# Patient Record
Sex: Male | Born: 1990 | Race: Black or African American | Hispanic: No | Marital: Single | Smoking: Never smoker
Health system: Southern US, Community
[De-identification: ages and names within clinical notes are randomized; demographics above are authoritative.]

---
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
## 2017-02-16 ENCOUNTER — Emergency Department (HOSPITAL_COMMUNITY): Payer: BLUE CROSS/BLUE SHIELD

## 2017-02-16 ENCOUNTER — Emergency Department (HOSPITAL_COMMUNITY)
Admission: EM | Admit: 2017-02-16 | Discharge: 2017-02-16 | Disposition: A | Discharge status: Home / Self Care | Payer: BLUE CROSS/BLUE SHIELD | Attending: Emergency Medicine | Admitting: Emergency Medicine

## 2017-02-16 ENCOUNTER — Encounter (HOSPITAL_COMMUNITY): Payer: Self-pay | Admitting: *Deleted

## 2017-02-16 DIAGNOSIS — R630 Anorexia: Secondary | ICD-10-CM | POA: Insufficient documentation

## 2017-02-16 DIAGNOSIS — R51 Headache: Secondary | ICD-10-CM | POA: Diagnosis not present

## 2017-02-16 DIAGNOSIS — M791 Myalgia: Secondary | ICD-10-CM | POA: Diagnosis not present

## 2017-02-16 DIAGNOSIS — R509 Fever, unspecified: Secondary | ICD-10-CM

## 2017-02-16 DIAGNOSIS — R42 Dizziness and giddiness: Secondary | ICD-10-CM | POA: Diagnosis not present

## 2017-02-16 LAB — COMPREHENSIVE METABOLIC PANEL
ALK PHOS: 52 U/L (ref 38–126)
ALT: 20 U/L (ref 17–63)
AST: 37 U/L (ref 15–41)
Albumin: 4 g/dL (ref 3.5–5.0)
Anion gap: 10 (ref 5–15)
BILIRUBIN TOTAL: 0.8 mg/dL (ref 0.3–1.2)
BUN: 11 mg/dL (ref 6–20)
CALCIUM: 8.4 mg/dL — AB (ref 8.9–10.3)
CO2: 27 mmol/L (ref 22–32)
CREATININE: 1.21 mg/dL (ref 0.61–1.24)
Chloride: 97 mmol/L — ABNORMAL LOW (ref 101–111)
GFR calc non Af Amer: >60 mL/min (ref >60)
Glucose, Bld: 75 mg/dL (ref 65–99)
Potassium: 3.1 mmol/L — ABNORMAL LOW (ref 3.5–5.1)
SODIUM: 134 mmol/L — AB (ref 135–145)
Total Protein: 7.6 g/dL (ref 6.5–8.1)

## 2017-02-16 LAB — CBC WITH DIFFERENTIAL/PLATELET
Basophils Absolute: 0 10*3/uL (ref 0.0–0.1)
Basophils Relative: 0 %
EOS ABS: 0 10*3/uL (ref 0.0–0.7)
EOS PCT: 0 %
HCT: 45.2 % (ref 39.0–52.0)
Hemoglobin: 15.4 g/dL (ref 13.0–17.0)
LYMPHS ABS: 1.2 10*3/uL (ref 0.7–4.0)
LYMPHS PCT: 21 %
MCH: 30.7 pg (ref 26.0–34.0)
MCHC: 34.1 g/dL (ref 30.0–36.0)
MCV: 90 fL (ref 78.0–100.0)
MONO ABS: 0.7 10*3/uL (ref 0.1–1.0)
Monocytes Relative: 13 %
Neutro Abs: 3.9 10*3/uL (ref 1.7–7.7)
Neutrophils Relative %: 66 %
PLATELETS: 157 10*3/uL (ref 150–400)
RBC: 5.02 MIL/uL (ref 4.22–5.81)
RDW: 12.8 % (ref 11.5–15.5)
WBC: 5.8 10*3/uL (ref 4.0–10.5)

## 2017-02-16 LAB — URINALYSIS, ROUTINE W REFLEX MICROSCOPIC
BACTERIA UA: NONE SEEN
Bilirubin Urine: NEGATIVE
Glucose, UA: NEGATIVE mg/dL
HGB URINE DIPSTICK: NEGATIVE
Ketones, ur: NEGATIVE mg/dL
Leukocytes, UA: NEGATIVE
NITRITE: NEGATIVE
Protein, ur: 30 mg/dL — AB
SPECIFIC GRAVITY, URINE: 1.02 (ref 1.005–1.030)
pH: 5 (ref 5.0–8.0)

## 2017-02-16 MED ORDER — ACETAMINOPHEN 325 MG PO TABS
650.0000 mg | ORAL_TABLET | Freq: Once | ORAL | Status: AC | PRN
  Administered 2017-02-16: 650 mg via ORAL
  Filled 2017-02-16: qty 2

## 2017-02-16 MED ORDER — SODIUM CHLORIDE 0.9 % IV BOLUS (SEPSIS)
1000.0000 mL | Freq: Once | INTRAVENOUS | Status: AC
  Administered 2017-02-16: 1000 mL via INTRAVENOUS

## 2017-02-16 MED ORDER — IBUPROFEN 800 MG PO TABS
800.0000 mg | ORAL_TABLET | Freq: Once | ORAL | Status: AC
  Administered 2017-02-16: 800 mg via ORAL
  Filled 2017-02-16: qty 1

## 2017-02-16 NOTE — ED Provider Notes (Signed)
[facility] DEPT Provider Note   CSN: [id number] Arrival date & time: 02/16/17  1056     History   Chief Complaint Chief Complaint  Patient presents with  . Fever    HPI Eric Underwood is a 26 y.o. male with no significant past medical history presenting with a 4 day history of fever to 103.which has responded briefly to tylenol.  He endorses having a headache and generalized myalgias for the first 2 days, headache now resolved but has lightheadedness with positional changes.  Denies cough, sob, cp, sore throat, neck stiffness, vision changes, n/v/d but has had no solid food intake in 4 days secondary to poor appetite.  He has tried to maintain fluid intake. Denies dysuria, back pain, rash.  No known tick exposures. Works as a Lobbyistfork lift driver, no recent travel or exposure to others with similar illness.  The history is provided by the patient.    History reviewed. No pertinent past medical history.  There are no active problems to display for this patient.   History reviewed. No pertinent surgical history.     Home Medications    Prior to Admission medications   Not on File    Family History No family history on file.  Social History Social History  Substance Use Topics  . Smoking status: Never Smoker  . Smokeless tobacco: Never Used  . Alcohol use No     Allergies   Penicillins   Review of Systems Review of Systems  Constitutional: Positive for fever.  HENT: Negative for congestion and sore throat.   Eyes: Negative.   Respiratory: Negative for chest tightness and shortness of breath.   Cardiovascular: Negative for chest pain.  Gastrointestinal: Negative for abdominal pain and nausea.  Genitourinary: Negative.   Musculoskeletal: Positive for myalgias. Negative for arthralgias, joint swelling and neck pain.  Skin: Negative.  Negative for rash and wound.  Neurological: Positive for headaches. Negative for dizziness, weakness, light-headedness and  numbness.  Psychiatric/Behavioral: Negative.      Physical Exam Updated Vital Signs BP 120/80   Pulse 83   Temp 99 F (37.2 C) (Oral)   Resp 18   Ht 6\' 1"  (1.854 m)   Wt 87 kg (191 lb 12.8 oz)   SpO2 97%   BMI 25.30 kg/m   Physical Exam  Constitutional: He appears well-developed and well-nourished. No distress.  HENT:  Head: Normocephalic and atraumatic.  Mouth/Throat: Oropharynx is clear and moist. No oropharyngeal exudate.  Eyes: Pupils are equal, round, and reactive to light. Conjunctivae and EOM are normal.  Neck: Normal range of motion. Neck supple.  Cardiovascular: Regular rhythm, normal heart sounds and intact distal pulses.  Tachycardia present.   Pulmonary/Chest: Effort normal and breath sounds normal. He has no wheezes.  Abdominal: Soft. Bowel sounds are normal. He exhibits no mass. There is no tenderness. There is no guarding.  Musculoskeletal: Normal range of motion.  Lymphadenopathy:    He has no cervical adenopathy.  Neurological: He is alert.  Skin: Skin is warm and dry. No rash noted.  Psychiatric: He has a normal mood and affect.  Nursing note and vitals reviewed.    ED Treatments / Results  Labs (all labs ordered are listed, but only abnormal results are displayed) Labs Reviewed  COMPREHENSIVE METABOLIC PANEL - Abnormal; Notable for the following:       Result Value   Sodium 134 (*)    Potassium 3.1 (*)    Chloride 97 (*)    Calcium  8.4 (*)    All other components within normal limits  URINALYSIS, ROUTINE W REFLEX MICROSCOPIC - Abnormal; Notable for the following:    APPearance HAZY (*)    Protein, ur 30 (*)    Squamous Epithelial / LPF 0-5 (*)    All other components within normal limits  CULTURE, BLOOD (ROUTINE X 2)  CULTURE, BLOOD (ROUTINE X 2)  CBC WITH DIFFERENTIAL/PLATELET    EKG  EKG Interpretation None       Radiology Dg Chest 2 View  Result Date: 02/16/2017 CLINICAL DATA:  Fever and dizziness for 4 days. EXAM: CHEST  2  VIEW COMPARISON:  PA and lateral chest 12/31/2008. FINDINGS: Lungs are clear. Heart size is normal. No pneumothorax pleural fluid. No bony abnormality. IMPRESSION: Normal chest. Electronically Signed   By: Drusilla Kannerhomas  Dalessio M.D.   On: 02/16/2017 14:56    Procedures Procedures (including critical care time)  Medications Ordered in ED Medications  acetaminophen (TYLENOL) tablet 650 mg (650 mg Oral Given 02/16/17 1129)  sodium chloride 0.9 % bolus 1,000 mL (0 mLs Intravenous Stopped 02/16/17 1619)  ibuprofen (ADVIL,MOTRIN) tablet 800 mg (800 mg Oral Given 02/16/17 1320)     Initial Impression / Assessment and Plan / ED Course  I have reviewed the triage vital signs and the nursing notes.  Pertinent labs & imaging results that were available during my care of the patient were reviewed by me and considered in my medical decision making (see chart for details).     Labs and imaging reviewed and stable. Pt with probable viral illness. Blood cx pending and pt aware of these pending tests.  Rest, increased fluid intake, alternate tylenol/motrin for fever reduction.  He was given strict return precautions.  Final Clinical Impressions(s) / ED Diagnoses   Final diagnoses:  Febrile illness    New Prescriptions There are no discharge medications for this patient.    Burgess Amordol, Maiya Kates, PA-C 02/16/17 1633    Samuel JesterMcManus, Kathleen, DO 02/20/17 1404

## 2017-02-16 NOTE — ED Notes (Signed)
Patient drank of water, tolerated well.

## 2017-02-16 NOTE — Discharge Instructions (Signed)
Your labs and chest xray are normal today.  As discussed, your blood cultures are pending and you will be notified if these are suggestive of being positive.  I suspect you have a viral infection however that should run its course. If you have any worsened or new symptoms, or your fever persists beyond the next several days, return here or see your doctor for a recheck.  Rest and make sure you are drinking plenty of fluids.  You may consider alternating tylenol and motrin taking the opposite medicine every 3 hours for better fever relief.

## 2017-02-16 NOTE — ED Triage Notes (Signed)
Also c/o dizziness. 

## 2017-02-16 NOTE — ED Notes (Signed)
PA at bedside.

## 2017-02-16 NOTE — ED Triage Notes (Signed)
Fever onset 4 days ago.

## 2017-02-17 ENCOUNTER — Emergency Department (HOSPITAL_COMMUNITY)
Admission: EM | Admit: 2017-02-17 | Discharge: 2017-02-17 | Disposition: A | Discharge status: Home / Self Care | Payer: BLUE CROSS/BLUE SHIELD | Attending: Emergency Medicine | Admitting: Emergency Medicine

## 2017-02-17 ENCOUNTER — Encounter (HOSPITAL_COMMUNITY): Payer: Self-pay | Admitting: Emergency Medicine

## 2017-02-17 DIAGNOSIS — R21 Rash and other nonspecific skin eruption: Secondary | ICD-10-CM

## 2017-02-17 DIAGNOSIS — R509 Fever, unspecified: Secondary | ICD-10-CM | POA: Diagnosis not present

## 2017-02-17 MED ORDER — OXYCODONE HCL 5 MG PO TABS
5.0000 mg | ORAL_TABLET | Freq: Once | ORAL | Status: AC
  Administered 2017-02-17: 5 mg via ORAL
  Filled 2017-02-17: qty 1

## 2017-02-17 MED ORDER — HYDROCODONE-ACETAMINOPHEN 5-325 MG PO TABS: 1.0000 | ORAL_TABLET | ORAL | 0 refills | Status: DC | PRN

## 2017-02-17 MED ORDER — ACYCLOVIR 400 MG PO TABS: 400.0000 mg | ORAL_TABLET | Freq: Three times a day (TID) | ORAL | 0 refills | Status: DC

## 2017-02-17 MED ORDER — ACYCLOVIR 800 MG PO TABS
400.0000 mg | ORAL_TABLET | Freq: Once | ORAL | Status: AC
  Administered 2017-02-17: 800 mg via ORAL
  Filled 2017-02-17: qty 1

## 2017-02-17 MED ORDER — ACETAMINOPHEN 500 MG PO TABS
1000.0000 mg | ORAL_TABLET | Freq: Once | ORAL | Status: AC
  Administered 2017-02-17: 1000 mg via ORAL
  Filled 2017-02-17: qty 2

## 2017-02-17 NOTE — ED Triage Notes (Signed)
Patient complaining of abscess to scrotum since yesterday.

## 2017-02-17 NOTE — Discharge Instructions (Signed)
As discussed,  I suspect that your rash may be a primary outbreak of herpes ( I have provided information about this infection).  However,  I do not want to give you this diagnosis unless your culture is positive.  You are being treated for this however with the acyclovir prescribed.  You may take the hydrocodone prescribed for pain relief.  This will make you drowsy - do not drive within 4 hours of taking this medication.  Call your doctor for a recheck early this coming week (and he will also be able to follow up on the culture sent today).

## 2017-02-17 NOTE — ED Provider Notes (Signed)
[facility] Provider Note   CSN: [id number] Arrival date & time: 02/17/17  [id number]     History   Chief Complaint Chief Complaint  Patient presents with  . Abscess    HPI Eric Underwood is a 26 y.o. male who was seen here yesterday for a 4 day history of fever without any other definitive sx other then headache and body aches returns today with a new onset painful rash at his right scrotum. He denies prior history of similar sx, denies dysuria, hematuria, penile discharge and denies being sexually active currently. He has continued to have a fever, his last dose of tylenol was taken early this am.  The history is provided by the patient.    History reviewed. No pertinent past medical history.  There are no active problems to display for this patient.   History reviewed. No pertinent surgical history.     Home Medications    Prior to Admission medications   Medication Sig Start Date End Date Taking? Authorizing Provider  acyclovir (ZOVIRAX) 400 MG tablet Take 1 tablet (400 mg total) by mouth 3 (three) times daily. 02/17/17   Burgess AmorIdol, Elizabet Schweppe, PA-C  HYDROcodone-acetaminophen (NORCO/VICODIN) 5-325 MG tablet Take 1 tablet by mouth every 4 (four) hours as needed. 02/17/17   Burgess AmorIdol, Xoie Kreuser, PA-C    Family History History reviewed. No pertinent family history.  Social History Social History  Substance Use Topics  . Smoking status: Never Smoker  . Smokeless tobacco: Never Used  . Alcohol use No     Allergies   Penicillins   Review of Systems Review of Systems  Constitutional: Positive for fever.  HENT: Negative for congestion and sore throat.   Eyes: Negative.   Respiratory: Negative for chest tightness and shortness of breath.   Cardiovascular: Negative for chest pain.  Gastrointestinal: Negative for abdominal pain and nausea.  Genitourinary: Negative.   Musculoskeletal: Negative for arthralgias, joint swelling and neck pain.  Skin: Positive for rash. Negative for  wound.  Neurological: Negative for dizziness, weakness, light-headedness, numbness and headaches.  Psychiatric/Behavioral: Negative.      Physical Exam Updated Vital Signs BP 131/82   Pulse (!) 120   Temp (!) 103.1 F (39.5 C) Comment: RN offered pt Motrin. pt states he will take it when he gets home.   Resp 18   Ht 6\' 1"  (1.854 m)   Wt 86.6 kg (191 lb)   SpO2 100%   BMI 25.20 kg/m   Physical Exam  Constitutional: He appears well-developed and well-nourished. No distress.  HENT:  Head: Normocephalic and atraumatic.  Eyes: Conjunctivae are normal.  Neck: Normal range of motion.  Cardiovascular: Normal rate, regular rhythm, normal heart sounds and intact distal pulses.   Pulmonary/Chest: Effort normal and breath sounds normal. He has no wheezes.  Abdominal: Soft. Bowel sounds are normal. There is no tenderness.  Genitourinary: Penis normal. Circumcised.  Genitourinary Comments: Painful rash right scotum, several lesions appear to be early wet ulcerations, others with intact vesicles.  No induration, no fluctuance.    Musculoskeletal: Normal range of motion.  Neurological: He is alert.  Skin: Skin is warm and dry.  Psychiatric: He has a normal mood and affect.  Nursing note and vitals reviewed.  Chaperone was present during exam.   ED Treatments / Results  Labs (all labs ordered are listed, but only abnormal results are displayed) Labs Reviewed  HSV CULTURE AND TYPING    EKG  EKG Interpretation None  Radiology Dg Chest 2 View  Result Date: 02/16/2017 CLINICAL DATA:  Fever and dizziness for 4 days. EXAM: CHEST  2 VIEW COMPARISON:  PA and lateral chest 12/31/2008. FINDINGS: Lungs are clear. Heart size is normal. No pneumothorax pleural fluid. No bony abnormality. IMPRESSION: Normal chest. Electronically Signed   By: Drusilla Kannerhomas  Dalessio M.D.   On: 02/16/2017 14:56    Procedures Procedures (including critical care time)  Medications Ordered in ED Medications    acetaminophen (TYLENOL) tablet 1,000 mg (1,000 mg Oral Given 02/17/17 0853)  acyclovir (ZOVIRAX) tablet 400 mg (800 mg Oral Given 02/17/17 0918)  oxyCODONE (Oxy IR/ROXICODONE) immediate release tablet 5 mg (5 mg Oral Given 02/17/17 0917)     Initial Impression / Assessment and Plan / ED Course  I have reviewed the triage vital signs and the nursing notes.  Pertinent labs & imaging results that were available during my care of the patient were reviewed by me and considered in my medical decision making (see chart for details).     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  Pt was started on acyclovir, first dose given here, also prescribed hydrocodone for pain relief.  Offered ibuprofen here for fever, pt defers. Advised he needs close f/u with pcp, he will call for appt early next week at which time cx should be resulted.Advised recheck here for any new or worsened sx.  Final Clinical Impressions(s) / ED Diagnoses   Final diagnoses:  Fever in adult  Scrotal rash    New Prescriptions Discharge Medication List as of 02/17/2017  9:24 AM    START taking these medications   Details  acyclovir (ZOVIRAX) 400 MG tablet Take 1 tablet (400 mg total) by mouth 3 (three) times daily., Starting Fri 02/17/2017, Print    HYDROcodone-acetaminophen (NORCO/VICODIN) 5-325 MG tablet Take 1 tablet by mouth every 4 (four) hours as needed., Starting Fri 02/17/2017, Print         Burgess AmorIdol, Justina Bertini, PA-C 02/17/17 1037    Samuel JesterMcManus, Kathleen, DO 02/20/17 1406

## 2017-02-19 ENCOUNTER — Encounter (HOSPITAL_COMMUNITY): Payer: Self-pay | Admitting: *Deleted

## 2017-02-19 ENCOUNTER — Emergency Department (HOSPITAL_COMMUNITY)
Admission: EM | Admit: 2017-02-19 | Discharge: 2017-02-19 | Disposition: A | Discharge status: Home / Self Care | Payer: BLUE CROSS/BLUE SHIELD | Attending: Emergency Medicine | Admitting: Emergency Medicine

## 2017-02-19 DIAGNOSIS — J029 Acute pharyngitis, unspecified: Secondary | ICD-10-CM | POA: Diagnosis present

## 2017-02-19 DIAGNOSIS — J358 Other chronic diseases of tonsils and adenoids: Secondary | ICD-10-CM

## 2017-02-19 DIAGNOSIS — J039 Acute tonsillitis, unspecified: Secondary | ICD-10-CM | POA: Insufficient documentation

## 2017-02-19 LAB — RAPID STREP SCREEN (MED CTR MEBANE ONLY): STREPTOCOCCUS, GROUP A SCREEN (DIRECT): NEGATIVE

## 2017-02-19 LAB — HSV CULTURE AND TYPING

## 2017-02-19 NOTE — Discharge Instructions (Signed)
Return if any problems.

## 2017-02-19 NOTE — ED Triage Notes (Signed)
Pt c/o sore throat that started yesterday. Unknown fever. Pt's father reports he has been taking Tylenol and Motrin around the clock to help with pain.

## 2017-02-19 NOTE — ED Provider Notes (Signed)
[facility] Provider Note   CSN: [id number] Arrival date & time: 02/19/17  1602     History   Chief Complaint Chief Complaint  Patient presents with  . Sore Throat    HPI Eric Underwood is a 26 y.o. male.  The history is provided by the patient. No language interpreter was used.  Sore Throat  This is a new problem. The current episode started more than 2 days ago. The problem occurs constantly. The problem has been gradually worsening. Nothing aggravates the symptoms. Nothing relieves the symptoms. He has tried nothing for the symptoms.  Pt complains of sorethroat for 2 days.  Pt had a fever earlier in the week.    History reviewed. No pertinent past medical history.  There are no active problems to display for this patient.   History reviewed. No pertinent surgical history.     Home Medications    Prior to Admission medications   Medication Sig Start Date End Date Taking? Authorizing Provider  acyclovir (ZOVIRAX) 400 MG tablet Take 1 tablet (400 mg total) by mouth 3 (three) times daily. 02/17/17   Burgess AmorIdol, Julie, PA-C  HYDROcodone-acetaminophen (NORCO/VICODIN) 5-325 MG tablet Take 1 tablet by mouth every 4 (four) hours as needed. 02/17/17   Burgess AmorIdol, Julie, PA-C    Family History No family history on file.  Social History Social History  Substance Use Topics  . Smoking status: Never Smoker  . Smokeless tobacco: Never Used  . Alcohol use No     Allergies   Penicillins   Review of Systems Review of Systems  All other systems reviewed and are negative.    Physical Exam Updated Vital Signs BP (!) 143/81 (BP Location: Right Arm)   Pulse 89   Temp 98.9 F (37.2 C) (Oral)   Resp 15   Ht 6\' 1"  (1.854 m)   Wt 86.6 kg (191 lb)   SpO2 100%   BMI 25.20 kg/m   Physical Exam  Constitutional: He appears well-developed and well-nourished.  HENT:  Head: Normocephalic and atraumatic.  Right Ear: External ear normal.  Left Ear: External ear normal.  Nose:  Nose normal.  Tonsils slight swelling,  Multiple tonsilliths.   Eyes: Pupils are equal, round, and reactive to light. Conjunctivae are normal.  Neck: Normal range of motion. Neck supple.  Cardiovascular: Normal rate and regular rhythm.   No murmur heard. Pulmonary/Chest: Effort normal and breath sounds normal. No respiratory distress.  Abdominal: Soft. There is no tenderness.  Musculoskeletal: He exhibits no edema.  Neurological: He is alert.  Skin: Skin is warm and dry.  Psychiatric: He has a normal mood and affect.  Nursing note and vitals reviewed.    ED Treatments / Results  Labs (all labs ordered are listed, but only abnormal results are displayed) Labs Reviewed  RAPID STREP SCREEN (NOT AT [facility])  CULTURE, GROUP A STREP [facility])    EKG  EKG Interpretation None       Radiology No results found.  Procedures Procedures (including critical care time)  Medications Ordered in ED Medications - No data to display   Initial Impression / Assessment and Plan / ED Course  I have reviewed the triage vital signs and the nursing notes.  Pertinent labs & imaging results that were available during my care of the patient were reviewed by me and considered in my medical decision making (see chart for details).     Probable viral illness, I doubt strep.  I doubt bacterail infection.  Final Clinical Impressions(s) / ED Diagnoses   Final diagnoses:  Tonsillith    New Prescriptions Discharge Medication List as of 02/19/2017  5:20 PM    An After Visit Summary was printed and given to the patient.    Elson AreasSofia, Dedric Ethington K, PA-C 02/19/17 1851    Vanetta MuldersZackowski, Scott, MD 02/20/17 [phone]

## 2017-02-21 LAB — CULTURE, BLOOD (ROUTINE X 2)
CULTURE: NO GROWTH
Culture: NO GROWTH
Special Requests: ADEQUATE
Special Requests: ADEQUATE

## 2017-02-21 LAB — CULTURE, GROUP A STREP (THRC)

## 2017-06-27 ENCOUNTER — Emergency Department (HOSPITAL_COMMUNITY): Payer: BLUE CROSS/BLUE SHIELD

## 2017-06-27 ENCOUNTER — Emergency Department (HOSPITAL_COMMUNITY)
Admission: EM | Admit: 2017-06-27 | Discharge: 2017-06-28 | Disposition: A | Discharge status: Home / Self Care | Payer: BLUE CROSS/BLUE SHIELD | Attending: Emergency Medicine | Admitting: Emergency Medicine

## 2017-06-27 ENCOUNTER — Other Ambulatory Visit: Payer: Self-pay

## 2017-06-27 ENCOUNTER — Encounter (HOSPITAL_COMMUNITY): Payer: Self-pay | Admitting: *Deleted

## 2017-06-27 DIAGNOSIS — E860 Dehydration: Secondary | ICD-10-CM | POA: Insufficient documentation

## 2017-06-27 DIAGNOSIS — R112 Nausea with vomiting, unspecified: Secondary | ICD-10-CM | POA: Insufficient documentation

## 2017-06-27 DIAGNOSIS — R197 Diarrhea, unspecified: Secondary | ICD-10-CM | POA: Diagnosis not present

## 2017-06-27 DIAGNOSIS — R110 Nausea: Secondary | ICD-10-CM

## 2017-06-27 DIAGNOSIS — R1030 Lower abdominal pain, unspecified: Secondary | ICD-10-CM | POA: Insufficient documentation

## 2017-06-27 DIAGNOSIS — R531 Weakness: Secondary | ICD-10-CM | POA: Diagnosis present

## 2017-06-27 LAB — COMPREHENSIVE METABOLIC PANEL
ALBUMIN: 3.7 g/dL (ref 3.5–5.0)
ALK PHOS: 52 U/L (ref 38–126)
ALT: 28 U/L (ref 17–63)
ANION GAP: 11 (ref 5–15)
AST: 48 U/L — ABNORMAL HIGH (ref 15–41)
BILIRUBIN TOTAL: 1.1 mg/dL (ref 0.3–1.2)
BUN: 18 mg/dL (ref 6–20)
CALCIUM: 8.2 mg/dL — AB (ref 8.9–10.3)
CO2: 22 mmol/L (ref 22–32)
Chloride: 96 mmol/L — ABNORMAL LOW (ref 101–111)
Creatinine, Ser: 1.13 mg/dL (ref 0.61–1.24)
GLUCOSE: 121 mg/dL — AB (ref 65–99)
POTASSIUM: 3.7 mmol/L (ref 3.5–5.1)
SODIUM: 129 mmol/L — AB (ref 135–145)
TOTAL PROTEIN: 7.9 g/dL (ref 6.5–8.1)

## 2017-06-27 LAB — URINALYSIS, ROUTINE W REFLEX MICROSCOPIC
BILIRUBIN URINE: NEGATIVE
Bacteria, UA: NONE SEEN
Glucose, UA: NEGATIVE mg/dL
KETONES UR: NEGATIVE mg/dL
LEUKOCYTES UA: NEGATIVE
NITRITE: NEGATIVE
PH: 6 (ref 5.0–8.0)
Protein, ur: NEGATIVE mg/dL
SPECIFIC GRAVITY, URINE: 1.006 (ref 1.005–1.030)
Squamous Epithelial / LPF: NONE SEEN

## 2017-06-27 LAB — CBC
HEMATOCRIT: 47.2 % (ref 39.0–52.0)
HEMOGLOBIN: 16.1 g/dL (ref 13.0–17.0)
MCH: 30.3 pg (ref 26.0–34.0)
MCHC: 34.1 g/dL (ref 30.0–36.0)
MCV: 88.7 fL (ref 78.0–100.0)
Platelets: 207 10*3/uL (ref 150–400)
RBC: 5.32 MIL/uL (ref 4.22–5.81)
RDW: 13 % (ref 11.5–15.5)
WBC: 12.1 10*3/uL — ABNORMAL HIGH (ref 4.0–10.5)

## 2017-06-27 LAB — LIPASE, BLOOD: Lipase: 19 U/L (ref 11–51)

## 2017-06-27 MED ORDER — LOPERAMIDE HCL 2 MG PO CAPS: 2.0000 mg | ORAL_CAPSULE | Freq: Four times a day (QID) | ORAL | 0 refills | Status: DC | PRN

## 2017-06-27 MED ORDER — ACETAMINOPHEN 325 MG PO TABS
ORAL_TABLET | ORAL | Status: AC
  Filled 2017-06-27: qty 2

## 2017-06-27 MED ORDER — ONDANSETRON 4 MG PO TBDP
4.0000 mg | ORAL_TABLET | Freq: Once | ORAL | Status: AC | PRN
  Administered 2017-06-27: 4 mg via ORAL
  Filled 2017-06-27: qty 1

## 2017-06-27 MED ORDER — ACETAMINOPHEN 325 MG PO TABS
650.0000 mg | ORAL_TABLET | Freq: Once | ORAL | Status: AC
  Administered 2017-06-27: 650 mg via ORAL

## 2017-06-27 MED ORDER — ONDANSETRON 4 MG PO TBDP: 4.0000 mg | ORAL_TABLET | Freq: Three times a day (TID) | ORAL | 0 refills | Status: DC | PRN

## 2017-06-27 MED ORDER — ONDANSETRON HCL 4 MG/5ML PO SOLN
4.0000 mg | Freq: Once | ORAL | Status: AC
  Administered 2017-06-27: 4 mg via ORAL
  Filled 2017-06-27: qty 1

## 2017-06-27 MED ORDER — CIPROFLOXACIN HCL 500 MG PO TABS: 500.0000 mg | ORAL_TABLET | Freq: Two times a day (BID) | ORAL | 0 refills | Status: DC

## 2017-06-27 MED ORDER — DICYCLOMINE HCL 10 MG PO CAPS
10.0000 mg | ORAL_CAPSULE | Freq: Once | ORAL | Status: AC
  Administered 2017-06-27: 10 mg via ORAL
  Filled 2017-06-27: qty 1

## 2017-06-27 MED ORDER — SODIUM CHLORIDE 0.9 % IV BOLUS (SEPSIS)
2000.0000 mL | Freq: Once | INTRAVENOUS | Status: AC
  Administered 2017-06-27: 2000 mL via INTRAVENOUS

## 2017-06-27 MED ORDER — METRONIDAZOLE 500 MG PO TABS: 500.0000 mg | ORAL_TABLET | Freq: Two times a day (BID) | ORAL | 0 refills | Status: DC

## 2017-06-27 MED ORDER — LOPERAMIDE HCL 2 MG PO CAPS
2.0000 mg | ORAL_CAPSULE | Freq: Once | ORAL | Status: AC
  Administered 2017-06-27: 2 mg via ORAL
  Filled 2017-06-27: qty 1

## 2017-06-27 NOTE — Discharge Instructions (Signed)
As we discussed, given your persistent diarrhea, we are going to give you a course of antibiotics (twice daily for 3 days) which may help resolve your symptoms.  Additionally, we encourage you to take over-the-counter loperamide (Imodium) according to the label instructions.  Please also read through the information below about foods that are rich in potassium and foods that can help with diarrhea.  We encourage you to drink plenty of fluids (water or Gatorade) and eat according to the instruction recommendations to help resolve your symptoms. ° °Return to the emergency department with new or worsening symptoms that concern you. ° ° °Diarrhea °Diarrhea is frequent loose and watery bowel movements. It can cause you to feel weak and dehydrated. Dehydration can cause you to become tired and thirsty, have a dry mouth, and have decreased urination that often is dark yellow. Diarrhea is a sign of another problem, most often an infection that will not last Eric Underwood. In most cases, diarrhea typically lasts 2-3 days. However, it can last longer if it is a sign of something more serious. It is important to treat your diarrhea as directed by your caregiver to lessen or prevent future episodes of diarrhea. °CAUSES  °Some common causes include: °Gastrointestinal infections caused by viruses, bacteria, or parasites. °Food poisoning or food allergies. °Certain medicines, such as antibiotics, chemotherapy, and laxatives. °Artificial sweeteners and fructose. °Digestive disorders. °HOME CARE INSTRUCTIONS °Ensure adequate fluid intake (hydration): Have 1 cup (8 oz) of fluid for each diarrhea episode. Avoid fluids that contain simple sugars or sports drinks, fruit juices, whole milk products, and sodas. Your urine should be clear or pale yellow if you are drinking enough fluids. Hydrate with an oral rehydration solution that you can purchase at pharmacies, retail stores, and online. You can prepare an oral rehydration solution at home by  mixing the following ingredients together: ° - tsp table salt. °¾ tsp baking soda. ° tsp salt substitute containing potassium chloride. °1  tablespoons sugar. °1 L (34 oz) of water. °Certain foods and beverages may increase the speed at which food moves through the gastrointestinal (GI) tract. These foods and beverages should be avoided and include: °Caffeinated and alcoholic beverages. °High-fiber foods, such as raw fruits and vegetables, nuts, seeds, and whole grain breads and cereals. °Foods and beverages sweetened with sugar alcohols, such as xylitol, sorbitol, and mannitol. °Some foods may be well tolerated and may help thicken stool including: °Starchy foods, such as rice, toast, pasta, low-sugar cereal, oatmeal, grits, baked potatoes, crackers, and bagels. °Bananas. °Applesauce. °Add probiotic-rich foods to help increase healthy bacteria in the GI tract, such as yogurt and fermented milk products. °Wash your hands well after each diarrhea episode. °Only take over-the-counter or prescription medicines as directed by your caregiver. °Take a warm bath to relieve any burning or pain from frequent diarrhea episodes. °SEEK IMMEDIATE MEDICAL CARE IF:  °You are unable to keep fluids down. °You have persistent vomiting. °You have blood in your stool, or your stools are black and tarry. °You do not urinate in 6-8 hours, or there is only a small amount of very dark urine. °You have abdominal pain that increases or localizes. °You have weakness, dizziness, confusion, or light-headedness. °You have a severe headache. °Your diarrhea gets worse or does not get better. °You have a fever or persistent symptoms for more than 2-3 days. °You have a fever and your symptoms suddenly get worse. °MAKE SURE YOU:  °Understand these instructions. °Will watch your condition. °Will get help right away if   you are not doing well or get worse. °  °This information is not intended to replace advice given to you by your health care provider.  Make sure you discuss any questions you have with your health care provider. °  °Document Released: 06/24/2002 Document Revised: 07/25/2014 Document Reviewed: 03/11/2012 °Elsevier Interactive Patient Education ©2016 [employer] ° °Food Choices to Help Relieve Diarrhea, Adult °When you have diarrhea, the foods you eat and your eating habits are very important. Choosing the right foods and drinks can help relieve diarrhea. Also, because diarrhea can last up to 7 days, you need to replace lost fluids and electrolytes (such as sodium, potassium, and chloride) in order to help prevent dehydration.  °WHAT GENERAL GUIDELINES DO I NEED TO FOLLOW? °Slowly drink 1 cup (8 oz) of fluid for each episode of diarrhea. If you are getting enough fluid, your urine will be clear or pale yellow. °Eat starchy foods. Some good choices include white rice, white toast, pasta, low-fiber cereal, baked potatoes (without the skin), saltine crackers, and bagels. °Avoid large servings of any cooked vegetables. °Limit fruit to two servings per day. A serving is ½ cup or 1 small piece. °Choose foods with less than 2 g of fiber per serving. °Limit fats to less than 8 tsp (38 g) per day. °Avoid fried foods. °Eat foods that have probiotics in them. Probiotics can be found in certain dairy products. °Avoid foods and beverages that may increase the speed at which food moves through the stomach and intestines (gastrointestinal tract). Things to avoid include: °High-fiber foods, such as dried fruit, raw fruits and vegetables, nuts, seeds, and whole grain foods. °Spicy foods and high-fat foods. °Foods and beverages sweetened with high-fructose corn syrup, honey, or sugar alcohols such as xylitol, sorbitol, and mannitol. °WHAT FOODS ARE RECOMMENDED? °Grains °White rice. White, French, or pita breads (fresh or toasted), including plain rolls, buns, or bagels. White pasta. Saltine, soda, or graham crackers. Pretzels. Low-fiber cereal. Cooked cereals made  with water (such as cornmeal, farina, or cream cereals). Plain muffins. Matzo. Melba toast. Zwieback.  °Vegetables °Potatoes (without the skin). Strained tomato and vegetable juices. Most well-cooked and canned vegetables without seeds. Tender lettuce. °Fruits °Cooked or canned applesauce, apricots, cherries, fruit cocktail, grapefruit, peaches, pears, or plums. Fresh bananas, apples without skin, cherries, grapes, cantaloupe, grapefruit, peaches, oranges, or plums.  °Meat and Other Protein Products °Baked or boiled chicken. Eggs. Tofu. Fish. Seafood. Smooth peanut butter. Ground or well-cooked tender beef, ham, veal, lamb, pork, or poultry.  °Dairy °Plain yogurt, kefir, and unsweetened liquid yogurt. Lactose-free milk, buttermilk, or soy milk. Plain hard cheese. °Beverages °Sport drinks. Clear broths. Diluted fruit juices (except prune). Regular, caffeine-free sodas such as ginger ale. Water. Decaffeinated teas. Oral rehydration solutions. Sugar-free beverages not sweetened with sugar alcohols. °Other °Bouillon, broth, or soups made from recommended foods.  °The items listed above may not be a complete list of recommended foods or beverages. Contact your dietitian for more options. °WHAT FOODS ARE NOT RECOMMENDED? °Grains °Whole grain, whole wheat, bran, or rye breads, rolls, pastas, crackers, and cereals. Wild or brown rice. Cereals that contain more than 2 g of fiber per serving. Corn tortillas or taco shells. Cooked or dry oatmeal. Granola. Popcorn. °Vegetables °Raw vegetables. Cabbage, broccoli, Brussels sprouts, artichokes, baked beans, beet greens, corn, kale, legumes, peas, sweet potatoes, and yams. Potato skins. Cooked spinach and cabbage. °Fruits °Dried fruit, including raisins and dates. Raw fruits. Stewed or dried prunes. Fresh apples with skin, apricots, mangoes, pears,   raspberries, and strawberries.  °Meat and Other Protein Products °Chunky peanut butter. Nuts and seeds. Beans and lentils. Bacon.    °Dairy °High-fat cheeses. Milk, chocolate milk, and beverages made with milk, such as milk shakes. Cream. Ice cream. °Sweets and Desserts °Sweet rolls, doughnuts, and sweet breads. Pancakes and waffles. °Fats and Oils °Butter. Cream sauces. Margarine. Salad oils. Plain salad dressings. Olives. Avocados.  °Beverages °Caffeinated beverages (such as coffee, tea, soda, or energy drinks). Alcoholic beverages. Fruit juices with pulp. Prune juice. Soft drinks sweetened with high-fructose corn syrup or sugar alcohols. °Other °Coconut. Hot sauce. Chili powder. Mayonnaise. Gravy. Cream-based or milk-based soups.  °The items listed above may not be a complete list of foods and beverages to avoid. Contact your dietitian for more information. °WHAT SHOULD I DO IF I BECOME DEHYDRATED? °Diarrhea can sometimes lead to dehydration. Signs of dehydration include dark urine and dry mouth and skin. If you think you are dehydrated, you should rehydrate with an oral rehydration solution. These solutions can be purchased at pharmacies, retail stores, or online.  °Drink ½-1 cup (120-240 mL) of oral rehydration solution each time you have an episode of diarrhea. If drinking this amount makes your diarrhea worse, try drinking smaller amounts more often. For example, drink 1-3 tsp (5-15 mL) every 5-10 minutes.  °A general rule for staying hydrated is to drink 1½-2 L of fluid per day. Talk to your health care provider about the specific amount you should be drinking each day. Drink enough fluids to keep your urine clear or pale yellow. °  °This information is not intended to replace advice given to you by your health care provider. Make sure you discuss any questions you have with your health care provider. °  °Document Released: 09/24/2003 Document Revised: 07/25/2014 Document Reviewed: 05/27/2013 °Elsevier Interactive Patient Education ©2016 [employer] ° °Potassium Content of Foods °Potassium is a mineral found in many foods and drinks.  It helps keep fluids and minerals balanced in your body and affects how steadily your heart beats. Potassium also helps control your blood pressure and keep your muscles and nervous system healthy. °Certain health conditions and medicines may change the balance of potassium in your body. When this happens, you can help balance your level of potassium through the foods that you do or do not eat. Your health care provider or dietitian may recommend an amount of potassium that you should have each day. The following lists of foods provide the amount of potassium (in parentheses) per serving in each item. °HIGH IN POTASSIUM  °The following foods and beverages have 200 mg or more of potassium per serving: °Apricots, 2 raw or 5 dry (200 mg). °Artichoke, 1 medium (345 mg). °Avocado, raw,  ¼ each (245 mg). °Banana, 1 medium (425 mg). °Beans, lima, or baked beans, canned, ½ cup (280 mg). °Beans, white, canned, ½ cup (595 mg). °Beef roast, 3 oz (320 mg). °Beef, ground, 3 oz (270 mg). °Beets, raw or cooked, ½ cup (260 mg). °Bran muffin, 2 oz (300 mg). °Broccoli, ½ cup (230 mg). °Brussels sprouts, ½ cup (250 mg). °Cantaloupe, ½ cup (215 mg). °Cereal, 100% bran, ½ cup (200-400 mg). °Cheeseburger, single, fast food, 1 each (225-400 mg). °Chicken, 3 oz (220 mg). °Clams, canned, 3 oz (535 mg). °Crab, 3 oz (225 mg). °Dates, 5 each (270 mg). °Dried beans and peas, ½ cup (300-475 mg). °Figs, dried, 2 each (260 mg). °Fish: halibut, tuna, cod, snapper, 3 oz (480 mg). °Fish: salmon, haddock, swordfish, perch, 3   oz (300 mg). °Fish, tuna, canned 3 oz (200 mg). °French fries, fast food, 3 oz (470 mg). °Granola with fruit and nuts, ½ cup (200 mg). °Grapefruit juice, ½ cup (200 mg). °Greens, beet, ½ cup (655 mg). °Honeydew melon, ½ cup (200 mg). °Kale, raw, 1 cup (300 mg). °Kiwi, 1 medium (240 mg). °Kohlrabi, rutabaga, parsnips, ½ cup (280 mg). °Lentils, ½ cup (365 mg). °Mango, 1 each (325 mg). °Milk, chocolate, 1 cup (420 mg). °Milk: nonfat,  low-fat, whole, buttermilk, 1 cup (350-380 mg). °Molasses, 1 Tbsp (295 mg). °Mushrooms, ½ cup (280) mg. °Nectarine, 1 each (275 mg). °Nuts: almonds, peanuts, hazelnuts, [place], cashew, mixed, 1 oz (200 mg). °Nuts, pistachios, 1 oz (295 mg). °Orange, 1 each (240 mg). °Orange juice, ½ cup (235 mg). °Papaya, medium, ½ fruit (390 mg). °Peanut butter, chunky, 2 Tbsp (240 mg). °Peanut butter, smooth, 2 Tbsp (210 mg). °Pear, 1 medium (200 mg). °Pomegranate, 1 whole (400 mg). °Pomegranate juice, ½ cup (215 mg). °Pork, 3 oz (350 mg). °Potato chips, salted, 1 oz (465 mg). °Potato, baked with skin, 1 medium (925 mg). °Potatoes, boiled, ½ cup (255 mg). °Potatoes, mashed, ½ cup (330 mg). °Prune juice, ½ cup (370 mg). °Prunes, 5 each (305 mg). °Pudding, chocolate, ½ cup (230 mg). °Pumpkin, canned, ½ cup (250 mg). °Raisins, seedless, ¼ cup (270 mg). °Seeds, sunflower or pumpkin, 1 oz (240 mg). °Soy milk, 1 cup (300 mg). °Spinach, ½ cup (420 mg). °Spinach, canned, ½ cup (370 mg). °Sweet potato, baked with skin, 1 medium (450 mg). °Swiss chard, ½ cup (480 mg). °Tomato or vegetable juice, ½ cup (275 mg). °Tomato sauce or puree, ½ cup (400-550 mg). °Tomato, raw, 1 medium (290 mg). °Tomatoes, canned, ½ cup (200-300 mg). °Turkey, 3 oz (250 mg). °Wheat germ, 1 oz (250 mg). °Winter squash, ½ cup (250 mg). °Yogurt, plain or fruited, 6 oz (260-435 mg). °Zucchini, ½ cup (220 mg). °MODERATE IN POTASSIUM °The following foods and beverages have 50-200 mg of potassium per serving: °Apple, 1 each (150 mg). °Apple juice, ½ cup (150 mg). °Applesauce, ½ cup (90 mg). °Apricot nectar, ½ cup (140 mg). °Asparagus, small spears, ½ cup or 6 spears (155 mg). °Bagel, cinnamon raisin, 1 each (130 mg). °Bagel, egg or plain, 4 in., 1 each (70 mg). °Beans, green, ½ cup (90 mg). °Beans, yellow, ½ cup (190 mg). °Beer, regular, 12 oz (100 mg). °Beets, canned, ½ cup (125 mg). °Blackberries, ½ cup (115 mg). °Blueberries, ½ cup (60 mg). °Bread, whole wheat, 1 slice  (70 mg). °Broccoli, raw, ½ cup (145 mg). °Cabbage, ½ cup (150 mg). °Carrots, cooked or raw, ½ cup (180 mg). °Cauliflower, raw, ½ cup (150 mg). °Celery, raw, ½ cup (155 mg). °Cereal, bran flakes, ½cup (120-150 mg). °Cheese, cottage, ½ cup (110 mg). °Cherries, 10 each (150 mg). °Chocolate, 1½ oz bar (165 mg). °Coffee, brewed 6 oz (90 mg). °Corn, ½ cup or 1 ear (195 mg). °Cucumbers, ½ cup (80 mg). °Egg, large, 1 each (60 mg). °Eggplant, ½ cup (60 mg). °Endive, raw, ½cup (80 mg). °English muffin, 1 each (65 mg). °Fish, orange roughy, 3 oz (150 mg). °Frankfurter, beef or pork, 1 each (75 mg). °Fruit cocktail, ½ cup (115 mg). °Grape juice, ½ cup (170 mg). °Grapefruit, ½ fruit (175 mg). °Grapes, ½ cup (155 mg). °Greens: kale, turnip, collard, ½ cup (110-150 mg). °Ice cream or frozen yogurt, chocolate, ½ cup (175 mg). °Ice cream or frozen yogurt, vanilla, ½ cup (120-150 mg). °Lemons, limes, 1 each (80 mg). °Lettuce,   all types, 1 cup (100 mg). °Mixed vegetables, ½ cup (150 mg). °Mushrooms, raw, ½ cup (110 mg). °Nuts: walnuts, pecans, or macadamia, 1 oz (125 mg). °Oatmeal, ½ cup (80 mg). °Okra, ½ cup (110 mg). °Onions, raw, ½ cup (120 mg). °Peach, 1 each (185 mg). °Peaches, canned, ½ cup (120 mg). °Pears, canned, ½ cup (120 mg). °Peas, green, frozen, ½ cup (90 mg). °Peppers, green, ½ cup (130 mg). °Peppers, red, ½ cup (160 mg). °Pineapple juice, ½ cup (165 mg). °Pineapple, fresh or canned, ½ cup (100 mg). °Plums, 1 each (105 mg). °Pudding, vanilla, ½ cup (150 mg). °Raspberries, ½ cup (90 mg). °Rhubarb, ½ cup (115 mg). °Rice, wild, ½ cup (80 mg). °Shrimp, 3 oz (155 mg). °Spinach, raw, 1 cup (170 mg). °Strawberries, ½ cup (125 mg). °Summer squash ½ cup (175-200 mg). °Swiss chard, raw, 1 cup (135 mg). °Tangerines, 1 each (140 mg). °Tea, brewed, 6 oz (65 mg). °Turnips, ½ cup (140 mg). °Watermelon, ½ cup (85 mg). °Wine, red, table, 5 oz (180 mg). °Wine, white, table, 5 oz (100 mg). °LOW IN POTASSIUM °The following foods and  beverages have less than 50 mg of potassium per serving. °Bread, white, 1 slice (30 mg). °Carbonated beverages, 12 oz (less than 5 mg). °Cheese, 1 oz (20-30 mg). °Cranberries, ½ cup (45 mg). °Cranberry juice cocktail, ½ cup (20 mg). °Fats and oils, 1 Tbsp (less than 5 mg). °Hummus, 1 Tbsp (32 mg). °Nectar: papaya, mango, or pear, ½ cup (35 mg). °Rice, white or brown, ½ cup (50 mg). °Spaghetti or macaroni, ½ cup cooked (30 mg). °Tortilla, flour or corn, 1 each (50 mg). °Waffle, 4 in., 1 each (50 mg). °Water chestnuts, ½ cup (40 mg). °  °This information is not intended to replace advice given to you by your health care provider. Make sure you discuss any questions you have with your health care provider. °  °Document Released: 02/15/2005 Document Revised: 07/09/2013 Document Reviewed: 05/31/2013 °Elsevier Interactive Patient Education ©2016 [employer] ° °Probiotics °WHAT ARE PROBIOTICS? °Probiotics are the good bacteria and yeasts that live in your body and keep you and your digestive system healthy. Probiotics also help your body's defense (immune) system and protect your body against bad bacterial growth.  °Certain foods contain probiotics, such as yogurt. Probiotics can also be purchased as a supplement. As with any supplement or drug, it is important to discuss its use with your health care provider.  °WHAT AFFECTS THE BALANCE OF BACTERIA IN MY BODY? °The balance of bacteria in your body can be affected by:  °Antibiotic medicines. Antibiotics are sometimes necessary to treat infection. Unfortunately, they may kill good or friendly bacteria in your body as well as the bad bacteria. This may lead to stomach problems like diarrhea, gas, and cramping. °Disease. Some conditions are the result of an overgrowth of bad bacteria, yeasts, parasites, or fungi. These conditions include:   °Infectious diarrhea. °Stomach and respiratory infections. °Skin infections. °Irritable bowel syndrome (IBS). °Inflammatory bowel  diseases. °Ulcer due to Helicobacter pylori (H. pylori) infection. °Tooth decay and periodontal disease. °Vaginal infections. °Stress and poor diet may also lower the good bacteria in your body.  °WHAT TYPE OF PROBIOTIC IS RIGHT FOR ME? °Probiotics are available over the counter at your local pharmacy, health food, or grocery store. They come in many different forms, combinations of strains, and dosing strengths. Some may need to be refrigerated. Always read the label for storage and usage instructions. °Specific strains have been shown to be more   effective for certain conditions. Ask your health care provider what option is best for you.  °WHY WOULD I NEED PROBIOTICS? °There are many reasons your health care provider might recommend a probiotic supplement, including:  °Diarrhea. °Constipation. °IBS. °Respiratory infections. °Yeast infections. °Acne, eczema, and other skin conditions. °Frequent urinary tract infections (UTIs). °ARE THERE SIDE EFFECTS OF PROBIOTICS? °Some people experience mild side effects when taking probiotics. Side effects are usually temporary and may include:  °Gas. °Bloating. °Cramping. °Rarely, serious side effects, such as infection or immune system changes, may occur. °WHAT ELSE DO I NEED TO KNOW ABOUT PROBIOTICS?  °There are many different strains of probiotics. Certain strains may be more effective depending on your condition. Probiotics are available in varying doses. Ask your health care provider which probiotic you should use and how often.   °If you are taking probiotics along with antibiotics, it is generally recommended to wait at least 2 hours between taking the antibiotic and taking the probiotic.   °FOR MORE INFORMATION:  °[employer] [facility] [employer] http://nccam.nih.gov/ °  °This information is not intended to replace advice given to you by your health care provider. Make sure you discuss any questions you have with your health care provider. °    °Document Released: 01/29/2014 Document Reviewed: 01/29/2014 °Elsevier Interactive Patient Education ©2016 [employer] ° ° °Hypokalemia °Hypokalemia means that the amount of potassium in the blood is lower than normal. Potassium is a chemical, called an electrolyte, that helps regulate the amount of fluid in the body. It also stimulates muscle contraction and helps nerves function properly. Most of the body's potassium is inside of cells, and only a very small amount is in the blood. Because the amount in the blood is so small, minor changes can be life-threatening. °CAUSES °Antibiotics. °Diarrhea or vomiting. °Using laxatives too much, which can cause diarrhea. °Chronic kidney disease. °Water pills (diuretics). °Eating disorders (bulimia). °Low magnesium level. °Sweating a lot. °SIGNS AND SYMPTOMS °Weakness. °Constipation. °Fatigue. °Muscle cramps. °Mental confusion. °Skipped heartbeats or irregular heartbeat (palpitations). °Tingling or numbness. °DIAGNOSIS  °Your health care provider can diagnose hypokalemia with blood tests. In addition to checking your potassium level, your health care provider may also check other lab tests. °TREATMENT °Hypokalemia can be treated with potassium supplements taken by mouth or adjustments in your current medicines. If your potassium level is very low, you may need to get potassium through a vein (IV) and be monitored in the hospital. A diet high in potassium is also helpful. Foods high in potassium are: °Nuts, such as peanuts and pistachios. °Seeds, such as sunflower seeds and pumpkin seeds. °Peas, lentils, and lima beans. °Whole grain and bran cereals and breads. °Fresh fruit and vegetables, such as apricots, avocado, bananas, cantaloupe, kiwi, oranges, tomatoes, asparagus, and potatoes. °Orange and tomato juices. °Red meats. °Fruit yogurt. °HOME CARE INSTRUCTIONS °Take all medicines as prescribed by your health care provider. °Maintain a healthy diet by including nutritious  food, such as fruits, vegetables, nuts, whole grains, and lean meats. °If you are taking a laxative, be sure to follow the directions on the label. °SEEK MEDICAL CARE IF: °Your weakness gets worse. °You feel your heart pounding or racing. °You are vomiting or having diarrhea. °You are diabetic and having trouble keeping your blood glucose in the normal range. °SEEK IMMEDIATE MEDICAL CARE IF: °You have chest pain, shortness of breath, or dizziness. °You are vomiting or having diarrhea for more than 2 days. °You faint. °MAKE SURE YOU:  °Understand   these instructions. °Will watch your condition. °Will get help right away if you are not doing well or get worse. °  °This information is not intended to replace advice given to you by your health care provider. Make sure you discuss any questions you have with your health care provider. °  °Document Released: 07/04/2005 Document Revised: 07/25/2014 Document Reviewed: 01/04/2013 °Elsevier Interactive Patient Education ©2016 [employer]. ° ° ° °

## 2017-06-27 NOTE — ED Provider Notes (Signed)
Emergency Department Provider Note   I have reviewed the triage vital signs and the nursing notes.   HISTORY  Chief Complaint Weakness   HPI Eric Underwood is a 26 y.o. male with no significant PMH presents to the emergency department for evaluation of this with multiple episodes of watery, nonbloody, diarrhea over the past 2 days.  He has vomited twice including once prior to arrival.  Reports associated diffuse abdominal discomfort worse in the lower abdomen.  No radiation of symptoms.  No chest pain or difficulty breathing.  Denies any dysuria, hesitancy, urgency.  He has not noticed any hematuria.  No back pain.  No unilateral weakness or numbness.  Patient denies sick contacts, recent hospitalizations, recent travel, or current antibiotic therapy.    History reviewed. No pertinent past medical history.  There are no active problems to display for this patient.   History reviewed. No pertinent surgical history.  Current Outpatient Rx  . Order #: [id number] Class: Print  . Order #: [id number] Class: Print  . Order #: [id number] Class: Print  . Order #: [id number] Class: Print    Allergies Penicillins  No family history on file.  Social History Social History   Tobacco Use  . Smoking status: Never Smoker  . Smokeless tobacco: Never Used  Substance Use Topics  . Alcohol use: No  . Drug use: No    Review of Systems  Constitutional: No fever/chills. Positive generalized weakness.  Eyes: No visual changes. ENT: No sore throat. Cardiovascular: Denies chest pain. Respiratory: Denies shortness of breath. Gastrointestinal: Positive lower abdominal pain. Positive nausea/vomiting. Positive diarrhea.  No constipation. Genitourinary: Negative for dysuria. Musculoskeletal: Negative for back pain. Skin: Negative for rash. Neurological: Negative for headaches, focal weakness or numbness.  10-point ROS otherwise  negative.  ____________________________________________   PHYSICAL EXAM:  VITAL SIGNS: ED Triage Vitals  Enc Vitals Group     BP 06/27/17 1912 133/89     Pulse Rate 06/27/17 1912 (!) 108     Resp 06/27/17 1912 16     Temp 06/27/17 1912 (!) 100.8 F (38.2 C)     Temp Source 06/27/17 1912 Oral     SpO2 06/27/17 1912 96 %     Weight 06/27/17 1912 178 lb (80.7 kg)     Height 06/27/17 1912 6\' 1"  (1.854 m)     Pain Score 06/27/17 1914 9    Constitutional: Alert and oriented. Well appearing and in no acute distress. Eyes: Conjunctivae are normal.  Head: Atraumatic. Nose: No congestion/rhinnorhea. Mouth/Throat: Mucous membranes are dry.  Neck: No stridor.   Cardiovascular: Tachycardia. Good peripheral circulation. Grossly normal heart sounds.   Respiratory: Normal respiratory effort.  No retractions. Lungs CTAB. Gastrointestinal: Soft with mild diffuse tenderness to palpation. No focal tenderness. No rebound or guarding. No distention.  Musculoskeletal: No lower extremity tenderness nor edema. No gross deformities of extremities. Neurologic:  Normal speech and language. No gross focal neurologic deficits are appreciated.  Skin:  Skin is warm, dry and intact. No rash noted.  ____________________________________________   LABS (all labs ordered are listed, but only abnormal results are displayed)  Labs Reviewed  COMPREHENSIVE METABOLIC PANEL - Abnormal; Notable for the following components:      Result Value   Sodium 129 (*)    Chloride 96 (*)    Glucose, Bld 121 (*)    Calcium 8.2 (*)    AST 48 (*)    All other components within normal limits  CBC - Abnormal; Notable for the following  components:   WBC 12.1 (*)    All other components within normal limits  URINALYSIS, ROUTINE W REFLEX MICROSCOPIC - Abnormal; Notable for the following components:   Hgb urine dipstick LARGE (*)    All other components within normal limits  GASTROINTESTINAL PANEL BY PCR, STOOL (REPLACES  STOOL CULTURE)  LIPASE, BLOOD   ____________________________________________  RADIOLOGY  CT renal pending.  ____________________________________________   PROCEDURES  Procedure(s) performed:   Procedures  None ____________________________________________   INITIAL IMPRESSION / ASSESSMENT AND PLAN / ED COURSE  Pertinent labs & imaging results that were available during my care of the patient were reviewed by me and considered in my medical decision making (see chart for details).  Patient presents to the emergency department with fever, tachycardia, profuse watery diarrhea, nausea/vomiting, and generalized weakness.  Patient with no focal weakness or numbness.  He has no respiratory symptoms.  Abdomen is significant for mild diffuse tenderness but no rebound or guarding.  No focal discomfort.  Symptoms are most consistent with a viral gastroenteritis.  Low suspicion for acute intra-abdominal process. Plan for IVF, Zofran, and labs to screen for electrolyte abnormality and reassess.  11:14 PM Patient labs reviewed. Patient with hematuria of unclear significance. Plan for CT renal protocol. Sending stool cultures for PCP to follow up on as an outpatient. Patient with continued diarrhea here. If CT negative, plan for discharge with Cipro and supportive care at home.   Care transferred to Dr. Bebe ShaggyWickline.  ____________________________________________  FINAL CLINICAL IMPRESSION(S) / ED DIAGNOSES  Final diagnoses:  Generalized weakness  Dehydration  Diarrhea of presumed infectious origin  Nausea     MEDICATIONS GIVEN DURING THIS VISIT:  Medications  acetaminophen (TYLENOL) 325 MG tablet (not administered)  ondansetron (ZOFRAN-ODT) disintegrating tablet 4 mg (4 mg Oral Given 06/27/17 1916)  acetaminophen (TYLENOL) tablet 650 mg (650 mg Oral Given 06/27/17 1916)  sodium chloride 0.9 % bolus 2,000 mL (2,000 mLs Intravenous New Bag/Given 06/27/17 2115)  ondansetron (ZOFRAN) 4  MG/5ML solution 4 mg (4 mg Oral Given 06/27/17 2115)  dicyclomine (BENTYL) capsule 10 mg (10 mg Oral Given 06/27/17 2115)    Note:  This document was prepared using Dragon voice recognition software and may include unintentional dictation errors.  Alona BeneJoshua Long, MD Emergency Medicine    Long, Arlyss RepressJoshua G, MD 06/28/17 [phone]

## 2017-06-27 NOTE — ED Triage Notes (Signed)
Pt reports he started feeling sick yesterday and vomited x1 and has had multiple episodes of diarrhea since yesterday as well.

## 2017-06-28 DIAGNOSIS — R531 Weakness: Secondary | ICD-10-CM | POA: Diagnosis not present

## 2017-06-28 LAB — GASTROINTESTINAL PANEL BY PCR, STOOL (REPLACES STOOL CULTURE)
ADENOVIRUS F40/41: NOT DETECTED
ASTROVIRUS: NOT DETECTED
CAMPYLOBACTER SPECIES: NOT DETECTED
Cryptosporidium: NOT DETECTED
Cyclospora cayetanensis: NOT DETECTED
ENTEROPATHOGENIC E COLI (EPEC): NOT DETECTED
ENTEROTOXIGENIC E COLI (ETEC): NOT DETECTED
Entamoeba histolytica: NOT DETECTED
Enteroaggregative E coli (EAEC): NOT DETECTED
Giardia lamblia: NOT DETECTED
NOROVIRUS GI/GII: NOT DETECTED
PLESIMONAS SHIGELLOIDES: NOT DETECTED
ROTAVIRUS A: NOT DETECTED
SHIGA LIKE TOXIN PRODUCING E COLI (STEC): NOT DETECTED
Salmonella species: NOT DETECTED
Sapovirus (I, II, IV, and V): NOT DETECTED
Shigella/Enteroinvasive E coli (EIEC): DETECTED — AB
VIBRIO SPECIES: NOT DETECTED
Vibrio cholerae: NOT DETECTED
Yersinia enterocolitica: NOT DETECTED

## 2017-06-28 NOTE — ED Notes (Signed)
Pt alert & oriented x4, stable gait. Patient given discharge instructions, paperwork & prescription(s). Patient  instructed to stop at the registration desk to finish any additional paperwork. Patient verbalized understanding. Pt left department w/ no further questions. 

## 2017-06-28 NOTE — ED Notes (Signed)
Pt notified of results.  States he will follow up with his family doctor but the diarrhea has lessened and is is feeling a little better.  Encouraged to push fluids, finish antibiotics, and make PCP appt tomorrow.

## 2017-06-28 NOTE — ED Provider Notes (Signed)
I assumed care in sign out to follow-up with CT imaging CT results reviewed with patient and his family Colitis noted no signs of renal stones Patient was informed of hematuria Plan to discharge home with PCP follow-up Dr. Edwin DadaLong's recommendations we will start Cipro Discussed strict return precautions   Zadie RhineWickline, Shernita Rabinovich, MD 06/28/17 0117

## 2017-06-28 NOTE — ED Notes (Signed)
Date and time results received: 06/28/17 5:54 PM  (use smartphrase ".now" to insert current time)  Test: Stool panel Critical Value: Shigella, entervasive e-coli  Name of Provider Notified: Criss AlvineGoldston  Orders Received? Or Actions Taken?: Orders Received - See Orders for details

## 2017-08-30 ENCOUNTER — Other Ambulatory Visit: Payer: Self-pay

## 2017-08-30 ENCOUNTER — Encounter (HOSPITAL_COMMUNITY): Payer: Self-pay | Admitting: Emergency Medicine

## 2017-08-30 ENCOUNTER — Emergency Department (HOSPITAL_COMMUNITY)
Admission: EM | Admit: 2017-08-30 | Discharge: 2017-08-30 | Disposition: A | Discharge status: Home / Self Care | Payer: BLUE CROSS/BLUE SHIELD | Attending: Emergency Medicine | Admitting: Emergency Medicine

## 2017-08-30 DIAGNOSIS — R590 Localized enlarged lymph nodes: Secondary | ICD-10-CM | POA: Insufficient documentation

## 2017-08-30 DIAGNOSIS — K130 Diseases of lips: Secondary | ICD-10-CM

## 2017-08-30 NOTE — ED Notes (Signed)
PA at the bedside to assess.  

## 2017-08-30 NOTE — ED Provider Notes (Signed)
[facility] EMERGENCY DEPARTMENT Provider Note   CSN: [id number] Arrival date & time: 08/30/17  0700     History   Chief Complaint Chief Complaint  Patient presents with  . Abscess    HPI Eric Underwood is a 27 y.o. male who presents today for evaluation of an area of swelling and tenderness to the right groin for 2 days.  He states that 2 days ago he noted an area of tenderness to the right groin and yesterday he noticed a firm "bump".  Pain worsens with palpation or with bending of the hip.  He denies erythema, drainage, or warmth to the area.  He denies fevers or chills.  He denies urinary symptoms, penile pain, penile drainage, or dysuria.  He states he has not been sexually active in over one year and as had STD testing since then.  He has not tried anything for his symptoms.  He is also complaining of  3 "bumps "that arose to his upper lip today.  He states that he will occasionally get similar bumps which he believes to be pimples which she will eventually "pop "with purulent drainage.  He states that the area is nontender, no burning or pain.  He denies any drainage.  No prior history of cultures.  The history is provided by the patient.    History reviewed. No pertinent past medical history.  There are no active problems to display for this patient.   History reviewed. No pertinent surgical history.     Home Medications    Prior to Admission medications   Medication Sig Start Date End Date Taking? Authorizing Provider  ciprofloxacin (CIPRO) 500 MG tablet Take 1 tablet (500 mg total) by mouth 2 (two) times daily. 06/27/17   Long, Arlyss Repress, MD  loperamide (IMODIUM) 2 MG capsule Take 1 capsule (2 mg total) by mouth 4 (four) times daily as needed for diarrhea or loose stools. 06/27/17   Long, Arlyss Repress, MD  ondansetron (ZOFRAN ODT) 4 MG disintegrating tablet Take 1 tablet (4 mg total) by mouth every 8 (eight) hours as needed for nausea or vomiting. 06/27/17   Long,  Arlyss Repress, MD    Family History History reviewed. No pertinent family history.  Social History Social History   Tobacco Use  . Smoking status: Never Smoker  . Smokeless tobacco: Never Used  Substance Use Topics  . Alcohol use: No  . Drug use: No     Allergies   Penicillins   Review of Systems Review of Systems  Constitutional: Positive for fever. Negative for chills.  Gastrointestinal: Negative for abdominal pain, nausea and vomiting.  Genitourinary: Negative for discharge, dysuria, frequency, hematuria, penile pain, penile swelling, scrotal swelling, testicular pain and urgency.  Skin:       Swelling     Physical Exam Updated Vital Signs BP (!) 146/81 (BP Location: Right Arm)   Pulse 85   Temp 98.6 F (37 C) (Oral)   Resp 18   Ht 6\' 1"  (1.854 m)   Wt 81.6 kg (180 lb)   SpO2 100%   BMI 23.75 kg/m   Physical Exam  Constitutional: He appears well-developed and well-nourished. No distress.  HENT:  Head: Normocephalic and atraumatic.  Three raised circular areas consistent with acne to the vermillion border of the right upper lip.  No tenderness, no drainage.  Nonvesicular.   Eyes: Conjunctivae and EOM are normal. Pupils are equal, round, and reactive to light. Right eye exhibits no discharge. Left eye  exhibits no discharge.  Neck: Normal range of motion. Neck supple. No JVD present. No tracheal deviation present.  Cardiovascular: Normal rate.  Pulmonary/Chest: Effort normal.  Abdominal: Soft. Bowel sounds are normal. He exhibits no distension. There is no tenderness. There is no guarding.  Musculoskeletal: He exhibits no edema.  Neurological: He is alert.  Skin: Skin is warm and dry. No erythema.  1x1cm firm well subsequent circumscribed nodule to the right inguinal region, tender to palpation.  No erythema, induration, or swelling noted.  Psychiatric: He has a normal mood and affect. His behavior is normal.  Nursing note and vitals reviewed.    ED  Treatments / Results  Labs (all labs ordered are listed, but only abnormal results are displayed) Labs Reviewed - No data to display  EKG  EKG Interpretation None       Radiology No results found.  Procedures Procedures (including critical care time) EMERGENCY DEPARTMENT US SOFT TISSUE INTERPRETATION "Study: Limited Soft Tissue Ultrasound"  INDICATIONS: Pain Multiple views of the body part were obtained in real-time with a multi-frequency linear probe  PERFORMED BY: Myself IMAGES ARCHIVED?: Yes SIDE:Right  BODY PART:groin INTERPRETATION:  No abcess noted and No cellulitis noted     Medications Ordered in ED Medications - No data to display   Initial Impression / Assessment and Plan / ED Course  I have reviewed the triage vital signs and the nursing notes.  Pertinent labs & imaging results that were available during my care of the patient were reviewed by me and considered in my medical decision making (see chart for details).     Patient presents with right-sided inguinal lymphadenopathy.  No urinary symptoms and no GU symptoms in general.  He states that he was recently tested for STDs and was found to be negative and has not been sexually active since then.  Doubt acute life-threatening GI/GU emergency. Bedside ultrasound was performed with no evidence of cellulitis or abscess.  He also has what appeared to be small areas of acne to the right upper lip.  No evidence of abscess or secondary skin infection in either case.  Advised patient to apply warm compresses.  Ibuprofen and Tylenol for pain.  Recommend follow-up with primary care physician for reevaluation of symptoms persist.  Discussed indications for return to the ED. Pt verbalized understanding of and agreement with plan and is safe for discharge home at this time.  no complaints prior to discharge.  Final Clinical Impressions(s) / ED Diagnoses   Final diagnoses:  Lip lesion  Lymphadenopathy, inguinal     ED Discharge Orders    None       Jeanie SewerFawze, Dickie Labarre A, PA-C 08/30/17 1412    Samuel JesterMcManus, Kathleen, DO 09/01/17 1304

## 2017-08-30 NOTE — ED Notes (Signed)
Patient given discharge instruction, verbalized understand. IV removed, band aid applied. Patient ambulatory out of the department.  

## 2017-08-30 NOTE — Discharge Instructions (Signed)
Apply warm compresses to the affected areas 2-3 times daily. Alternate 600 mg of ibuprofen and [phone] mg of Tylenol every 3 hours as needed for pain. Do not exceed 4000 mg of Tylenol daily.  Follow-up with primary care physician for reevaluation of symptoms.  Return to the emergency department if any concerning signs or symptoms develop such as fevers, redness, worsening swelling, or abnormal drainage.

## 2017-08-30 NOTE — ED Triage Notes (Signed)
Pt reports right hip x2 days. Pt reports site tender x1 day and reports "a bump" came on right hip yesterday. Pt denies fever, chills, nausea.

## 2018-08-13 ENCOUNTER — Other Ambulatory Visit: Payer: Self-pay

## 2018-08-13 ENCOUNTER — Encounter (HOSPITAL_COMMUNITY): Payer: Self-pay | Admitting: Emergency Medicine

## 2018-08-13 ENCOUNTER — Emergency Department (HOSPITAL_COMMUNITY)
Admission: EM | Admit: 2018-08-13 | Discharge: 2018-08-13 | Disposition: A | Discharge status: Home / Self Care | Payer: BLUE CROSS/BLUE SHIELD | Attending: Emergency Medicine | Admitting: Emergency Medicine

## 2018-08-13 DIAGNOSIS — K6289 Other specified diseases of anus and rectum: Secondary | ICD-10-CM | POA: Insufficient documentation

## 2018-08-13 DIAGNOSIS — Z79899 Other long term (current) drug therapy: Secondary | ICD-10-CM | POA: Diagnosis not present

## 2018-08-13 DIAGNOSIS — K629 Disease of anus and rectum, unspecified: Secondary | ICD-10-CM | POA: Diagnosis present

## 2018-08-13 MED ORDER — LIDOCAINE-HYDROCORTISONE ACE 3-1 % RE KIT: 1.0000 "application " | PACK | Freq: Two times a day (BID) | RECTAL | 0 refills | Status: DC

## 2018-08-13 NOTE — Discharge Instructions (Addendum)
Use the medication prescribed for your pain and suspected internal hemorrhoid.  You should followup with a general surgeon (Dr. Lovell Sheehan) who can assist with an internal exam if your symptoms are not improving.  I recommend a warm water soak twice daily prior to applying the medicated cream.  This should help with pain and with healing.

## 2018-08-13 NOTE — ED Triage Notes (Signed)
Patient reports rectal pain that started on Wednesday, denies bleeding. Last BM this am.

## 2018-08-13 NOTE — ED Provider Notes (Signed)
[facility] EMERGENCY DEPARTMENT Provider Note   CSN: [id number] Arrival date & time: 08/13/18  1103     History   Chief Complaint Chief Complaint  Patient presents with  . Rectal Pain    HPI Eric Underwood is a 28 y.o. male with no significant past medical history but with a prior history of hemorrhoids presenting with a 5 day history of rectal pain.  He reports no problems with rectal swelling, bleeding or constipation but endorses increased pain with bowel movements, described as sharp and severe, lasting about 15 minutes after the bm, then feels just like a pressure sensation chronically.  He has used warm soaks and preparation H with minimal improvement in his symptoms.  He denies fevers, chills, abdominal pain, no changes in his urine stream or in frequency of urination.  He is not sexually active, denies any rectal trauma.    The history is provided by the patient.    History reviewed. No pertinent past medical history.  There are no active problems to display for this patient.   History reviewed. No pertinent surgical history.      Home Medications    Prior to Admission medications   Medication Sig Start Date End Date Taking? Authorizing Provider  ciprofloxacin (CIPRO) 500 MG tablet Take 1 tablet (500 mg total) by mouth 2 (two) times daily. 06/27/17   Long, Wonda Olds, MD  lidocaine-hydrocortisone (ANAMANTLE) 3-1 % KIT Place 1 application rectally 2 (two) times daily. 08/13/18   Evalee Jefferson, PA-C  loperamide (IMODIUM) 2 MG capsule Take 1 capsule (2 mg total) by mouth 4 (four) times daily as needed for diarrhea or loose stools. 06/27/17   Long, Wonda Olds, MD  ondansetron (ZOFRAN ODT) 4 MG disintegrating tablet Take 1 tablet (4 mg total) by mouth every 8 (eight) hours as needed for nausea or vomiting. 06/27/17   Long, Wonda Olds, MD    Family History History reviewed. No pertinent family history.  Social History Social History   Tobacco Use  . Smoking status: Never  Smoker  . Smokeless tobacco: Never Used  Substance Use Topics  . Alcohol use: No  . Drug use: No     Allergies   Penicillins   Review of Systems Review of Systems  Constitutional: Negative for chills and fever.  HENT: Negative.   Eyes: Negative.   Respiratory: Negative for chest tightness and shortness of breath.   Cardiovascular: Negative for chest pain.  Gastrointestinal: Positive for rectal pain. Negative for abdominal pain, anal bleeding, nausea and vomiting.  Genitourinary: Negative.  Negative for decreased urine volume, hematuria, penile pain and testicular pain.  Musculoskeletal: Negative for arthralgias, joint swelling and neck pain.  Skin: Negative.  Negative for rash and wound.  Neurological: Negative.   Psychiatric/Behavioral: Negative.      Physical Exam Updated Vital Signs BP 124/78 (BP Location: Right Arm)   Pulse 79   Temp 98.7 F (37.1 C) (Oral)   Resp 16   Ht '6\' 1"'  (1.854 m)   Wt 81.6 kg   SpO2 98%   BMI 23.75 kg/m   Physical Exam Vitals signs and nursing note reviewed. Exam conducted with a chaperone present.  Constitutional:      Appearance: He is well-developed.  HENT:     Head: Normocephalic and atraumatic.  Eyes:     Conjunctiva/sclera: Conjunctivae normal.  Neck:     Musculoskeletal: Normal range of motion.  Cardiovascular:     Rate and Rhythm: Normal rate.  Pulmonary:  Effort: Pulmonary effort is normal.     Breath sounds: No wheezing.  Abdominal:     General: Bowel sounds are normal.     Palpations: Abdomen is soft. There is no mass.     Tenderness: There is no abdominal tenderness. There is no guarding.  Genitourinary:    Prostate: Not tender.     Rectum: Tenderness present. No mass, anal fissure, external hemorrhoid or internal hemorrhoid. Abnormal anal tone.     Comments: Increased anal tone.  No obvious fissure appreciated.  No external or internal hemorrhoid. Musculoskeletal: Normal range of motion.  Skin:    General:  Skin is warm and dry.  Neurological:     Mental Status: He is alert.      ED Treatments / Results  Labs (all labs ordered are listed, but only abnormal results are displayed) Labs Reviewed - No data to display  EKG None  Radiology No results found.  Procedures Procedures (including critical care time)  Medications Ordered in ED Medications - No data to display   Initial Impression / Assessment and Plan / ED Course  I have reviewed the triage vital signs and the nursing notes.  Pertinent labs & imaging results that were available during my care of the patient were reviewed by me and considered in my medical decision making (see chart for details).     Patient with rectal pain of unclear etiology, he may simply be having rectal spasm.  There is no obvious anal fissure.  There is no edema or induration to suggest abscess.  He was prescribed lidocaine with hydrocortisone, discussed warm sits baths twice daily.  He denies constipation.  He was referred to general surgery for follow-up care if symptoms are not improved with this treatment.  Final Clinical Impressions(s) / ED Diagnoses   Final diagnoses:  Rectal pain    ED Discharge Orders         Ordered    lidocaine-hydrocortisone (ANAMANTLE) 3-1 % KIT  2 times daily     08/13/18 1725           Evalee Jefferson, Hershal Coria 08/13/18 1747    Milton Ferguson, MD 08/13/18 2332

## 2018-10-16 IMAGING — DX DG CHEST 2V
2 series · 2 of 2 positions shown · non-contrast
Comparison: PA and lateral chest 12/31/2008.

CLINICAL DATA: Fever and dizziness for 4 days.

EXAM:
CHEST  2 VIEW

[chest pa]
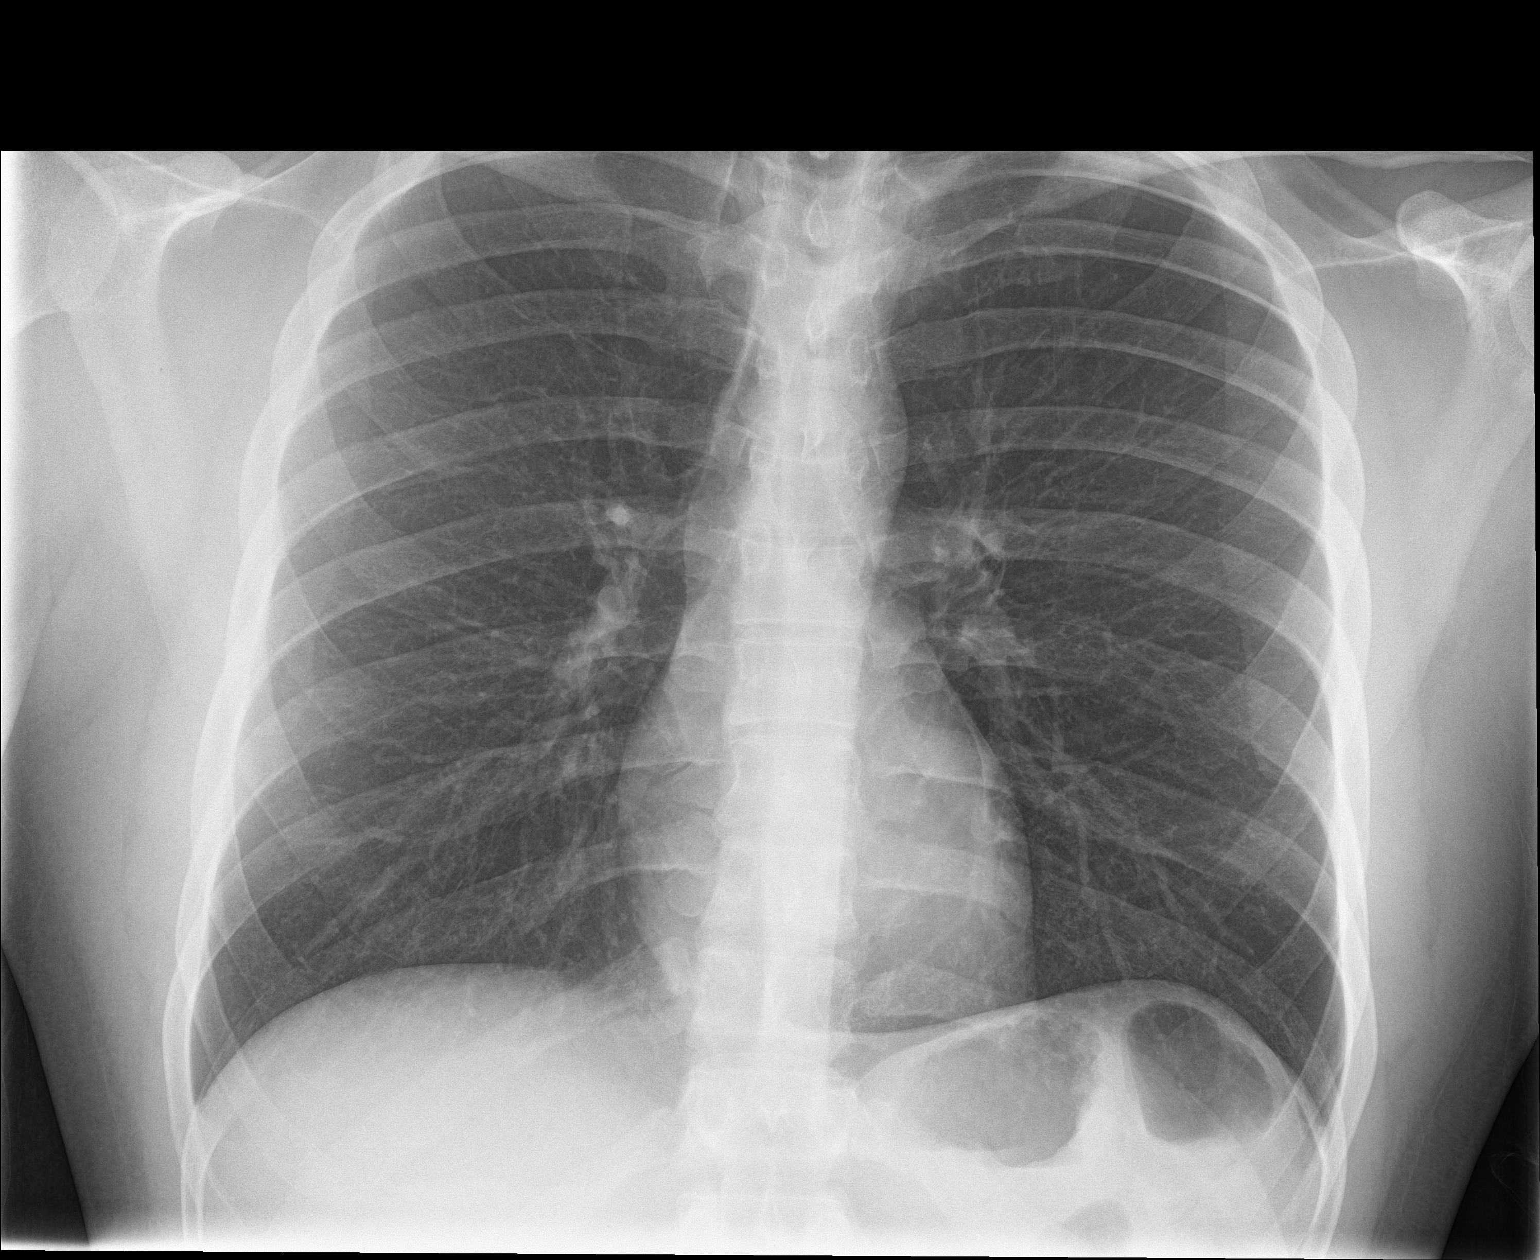

[chest lat]
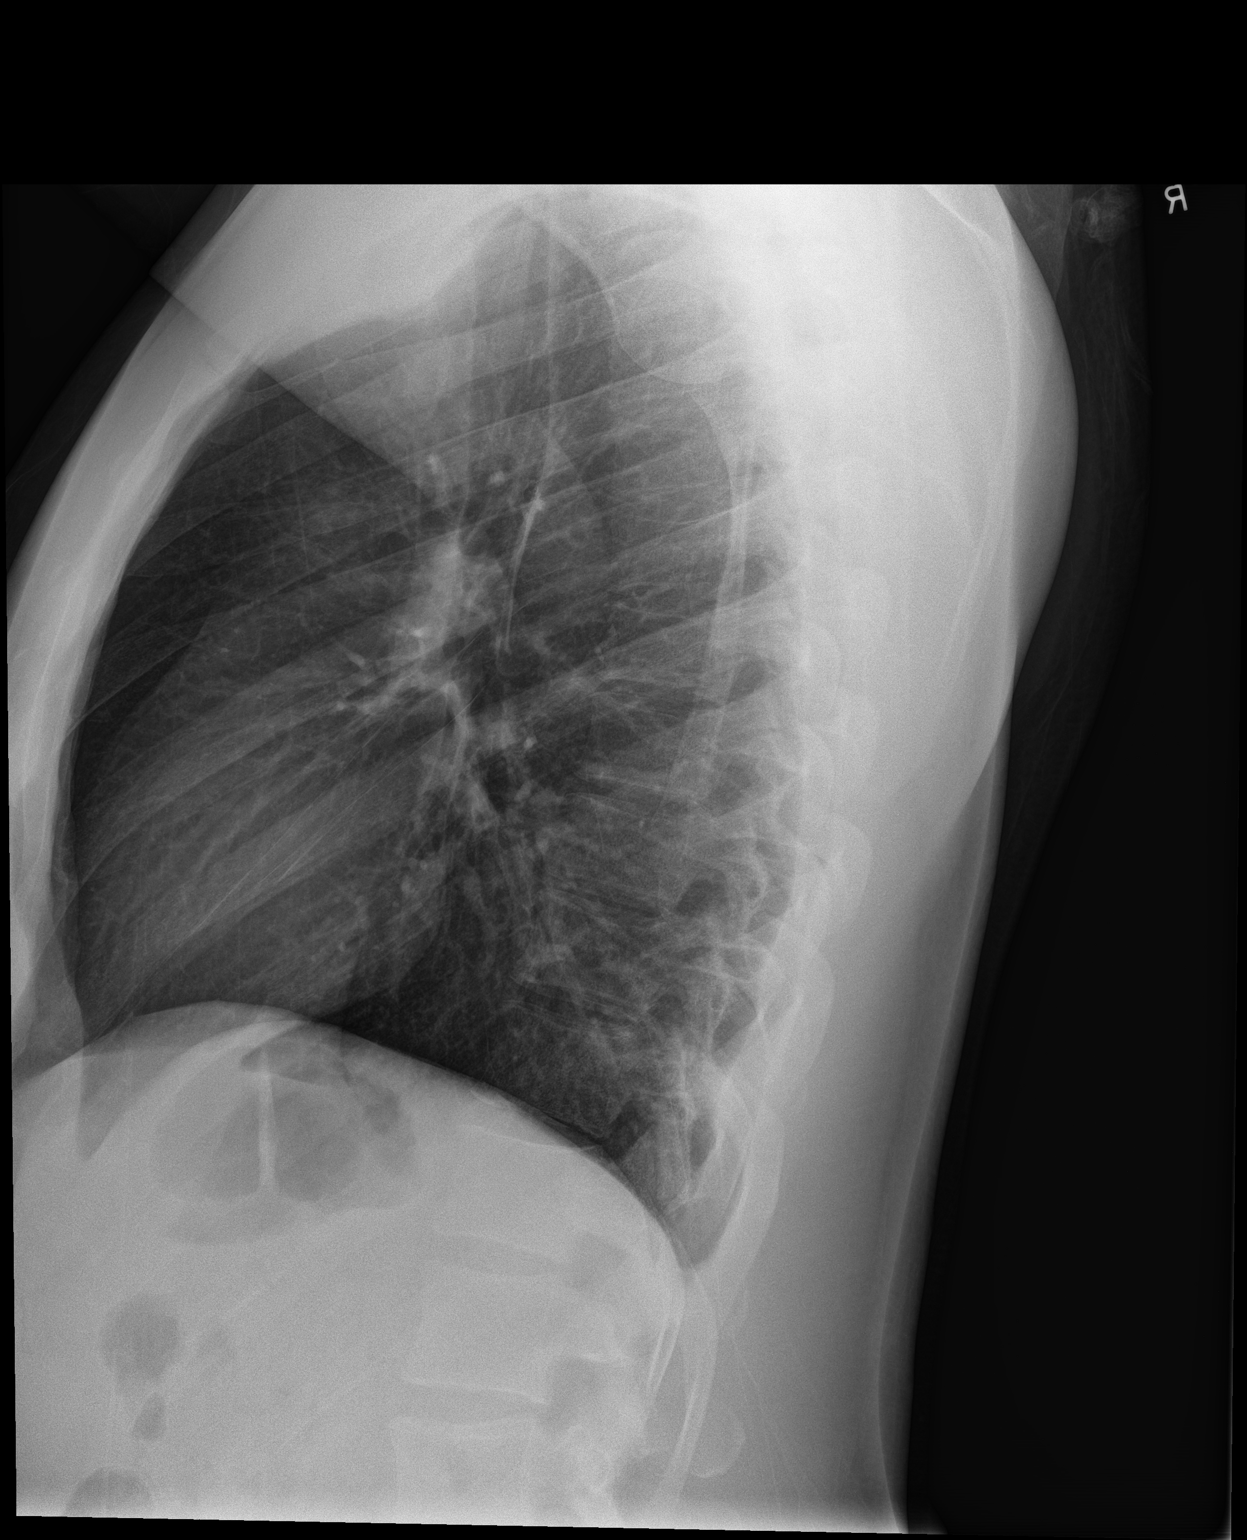

[2 of 2 positions shown; findings below may reference images not displayed]

FINDINGS: Lungs are clear. Heart size is normal. No pneumothorax pleural
fluid. No bony abnormality.
IMPRESSION: Normal chest.

## 2019-02-25 IMAGING — CT CT RENAL STONE PROTOCOL
2 of 4 series · 16 of 46 positions shown, 18 images · non-contrast
Comparison: None.

CLINICAL DATA: Vomiting and diarrhea, onset yesterday.  Hematuria.

EXAM:
CT ABDOMEN AND PELVIS WITHOUT CONTRAST
TECHNIQUE: Multidetector CT imaging of the abdomen and pelvis was performed
following the standard protocol without IV contrast.

[Series 2: axial st · axial · 0.72mm/px · z∈[+906,+1351]mm · 13 of 99 slices shown, 15 images]
[im 5/99  soft-tissue]
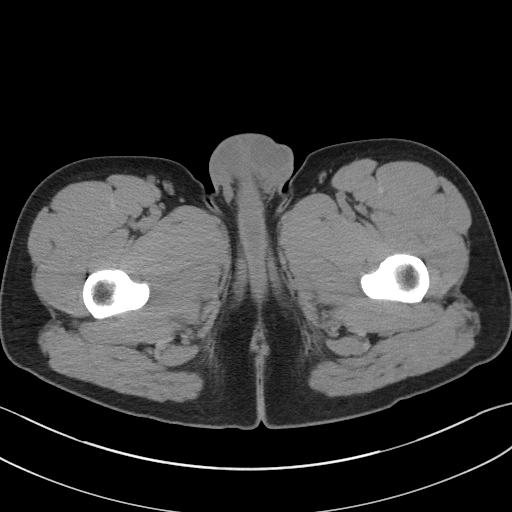
[im 5/99  bone]
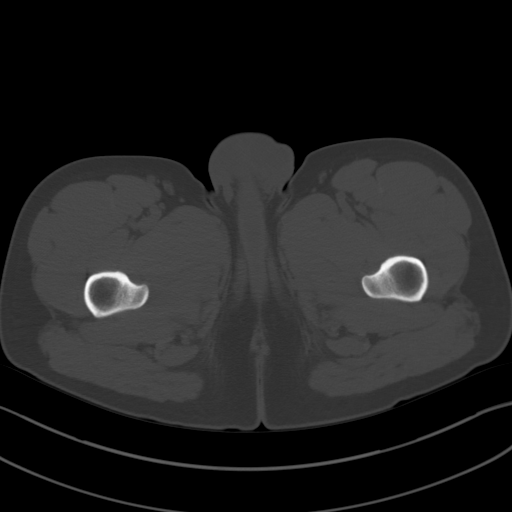
[im 13/99  soft-tissue]
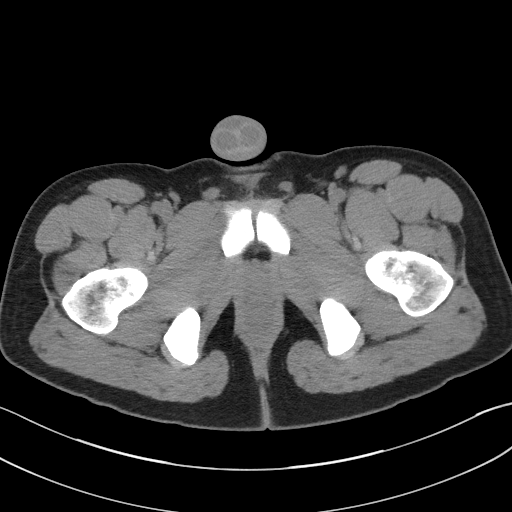
[im 22/99  soft-tissue]
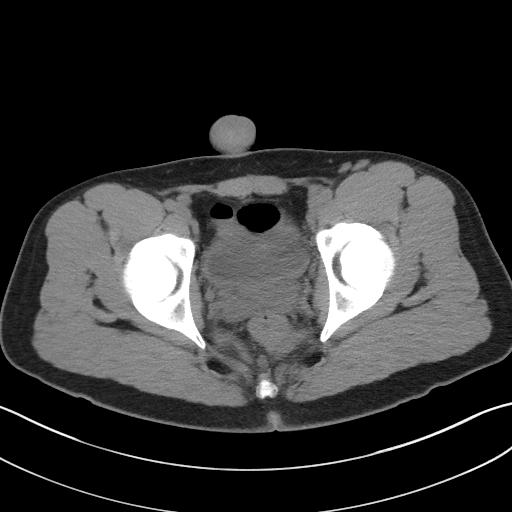
[im 26/99  soft-tissue]
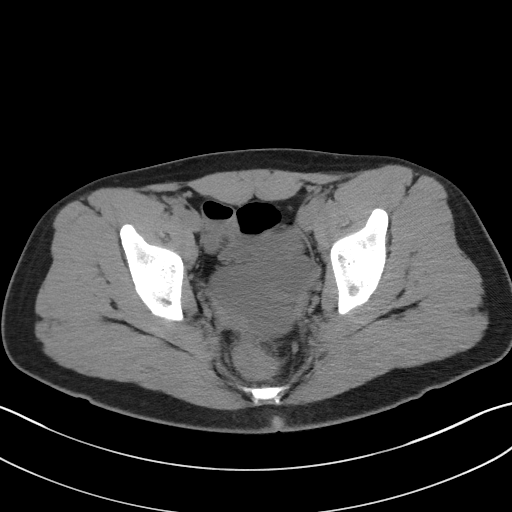
[im 35/99  soft-tissue]
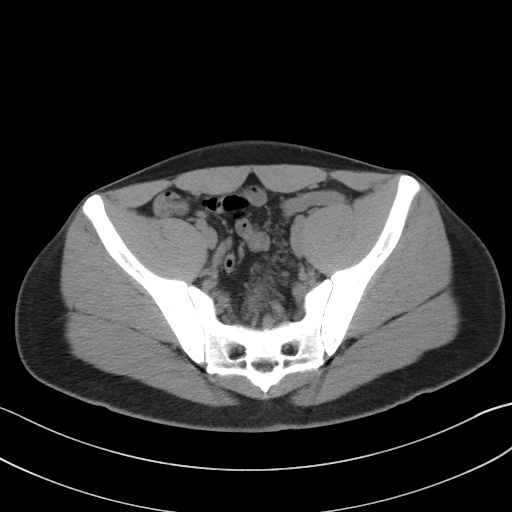
[im 43/99  soft-tissue]
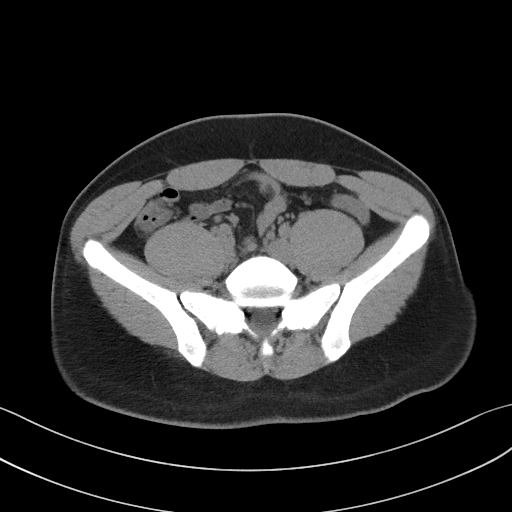
[im 52/99  soft-tissue]
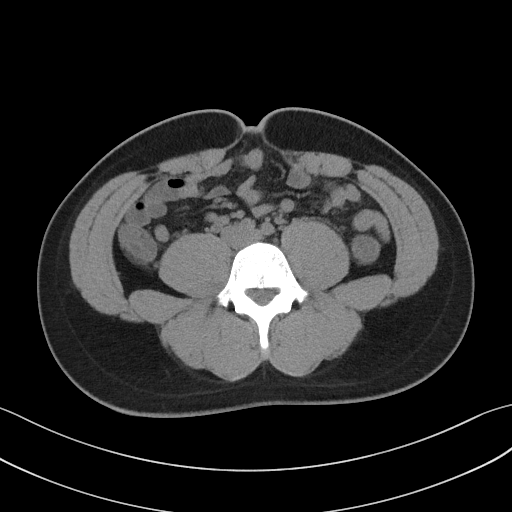
[im 56/99  soft-tissue]
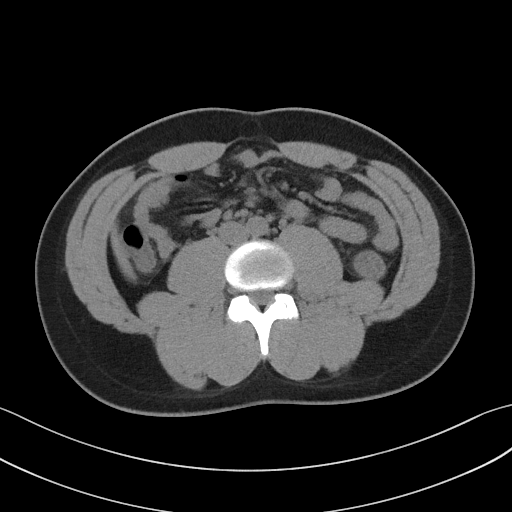
[im 64/99  soft-tissue]
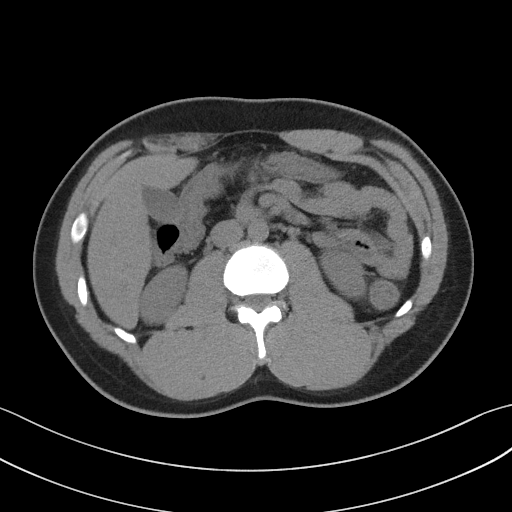
[im 64/99  bone]
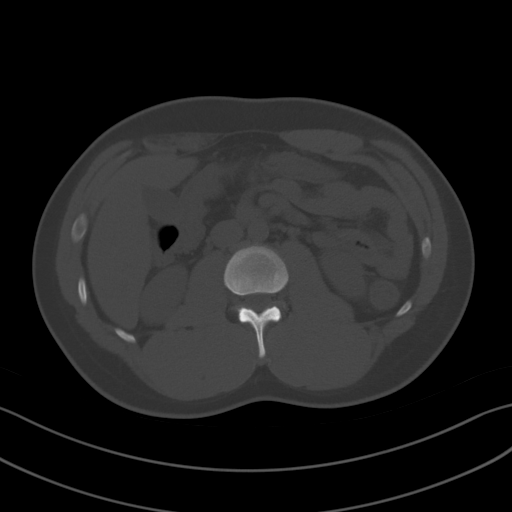
[im 73/99  soft-tissue]
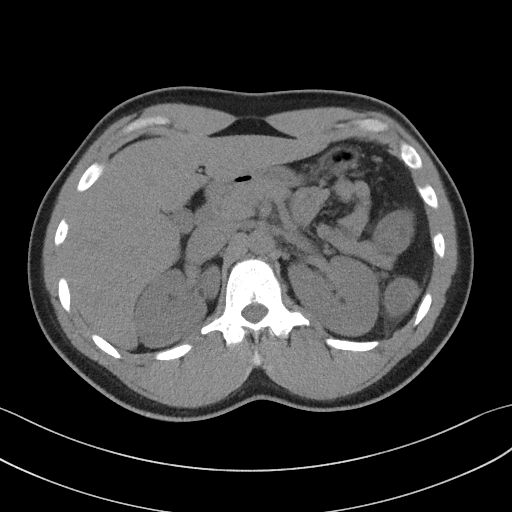
[im 77/99  soft-tissue]
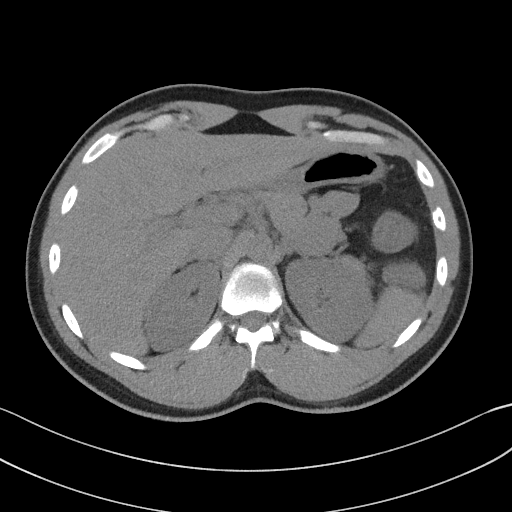
[im 86/99  soft-tissue]
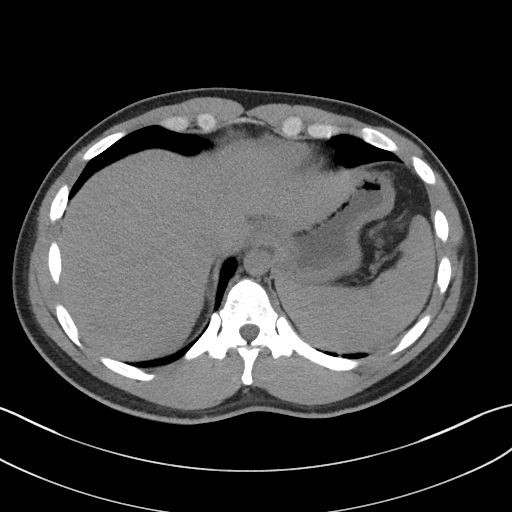
[im 94/99  soft-tissue]
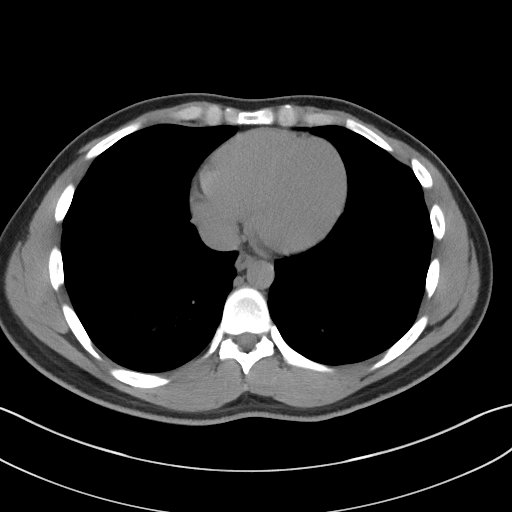

[Series 5: coronal st · coronal · 0.80mm/px · 3 of 73 slices shown]
[im 25/73  soft-tissue]
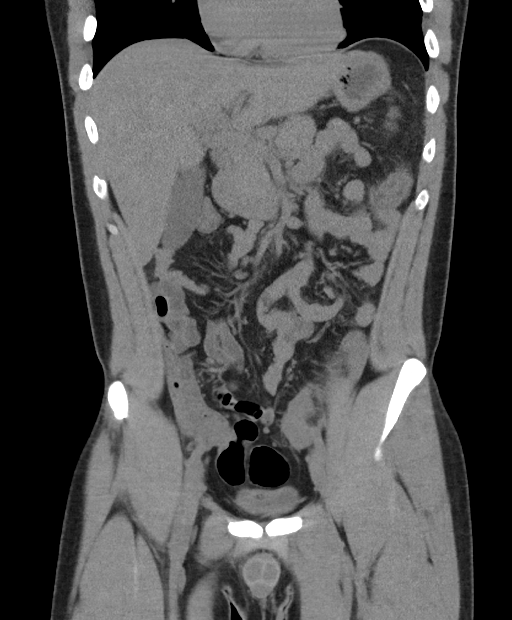
[im 33/73  soft-tissue]
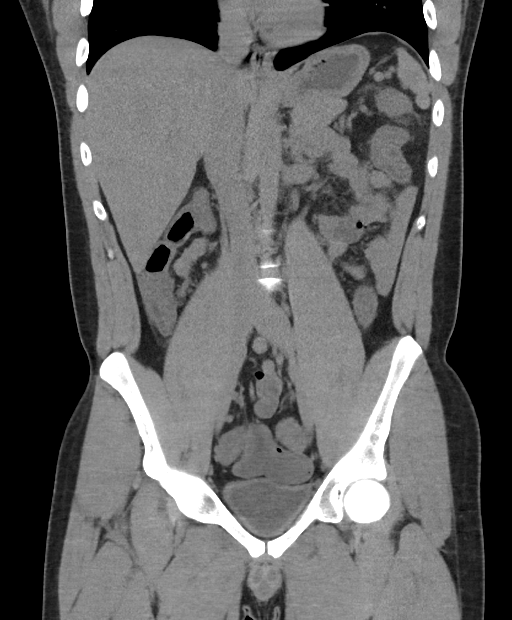
[im 41/73  soft-tissue]
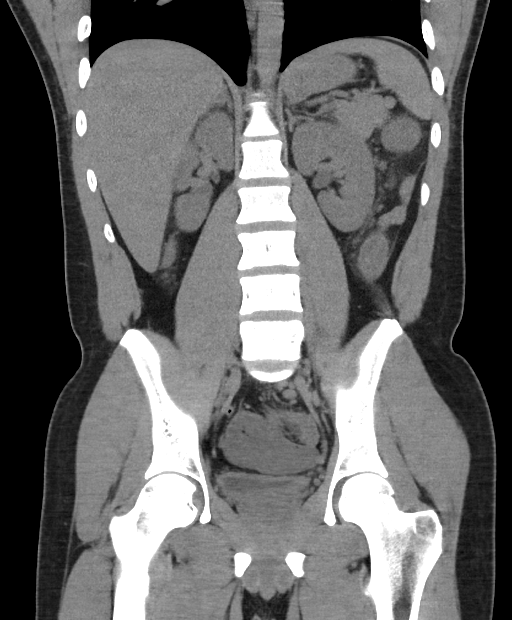

[16 of 46 positions shown; findings below may reference images not displayed]

FINDINGS: Lower chest: No acute abnormality.

Hepatobiliary: No focal liver abnormality is seen. No gallstones,
gallbladder wall thickening, or biliary dilatation.

Pancreas: Unremarkable. No pancreatic ductal dilatation or
surrounding inflammatory changes.

Spleen: Normal in size without focal abnormality.

Adrenals/Urinary Tract: Adrenal glands are unremarkable. Kidneys are
normal, without renal calculi, focal lesion, or hydronephrosis.
Bladder is unremarkable.

Stomach/Bowel: Stomach is unremarkable. Small bowel is normal. There
is diffuse colonic mural thickening and edema, likely colitis. No
bowel obstruction. No extraluminal gas. Appendix is normal.

Vascular/Lymphatic: No significant vascular findings are present. No
enlarged abdominal or pelvic lymph nodes.

Reproductive: Unremarkable

Other: No ascites

Musculoskeletal: No significant skeletal lesion. Fat containing
umbilical hernia.
IMPRESSION: Colonic mural thickening and edema, suspicious for colitis. No bowel
obstruction or perforation.

## 2019-09-12 ENCOUNTER — Encounter (HOSPITAL_COMMUNITY): Payer: Self-pay | Admitting: Emergency Medicine

## 2019-09-12 ENCOUNTER — Emergency Department (HOSPITAL_COMMUNITY)
Admission: EM | Admit: 2019-09-12 | Discharge: 2019-09-12 | Disposition: A | Discharge status: Home / Self Care | Payer: BC Managed Care – PPO | Attending: Emergency Medicine | Admitting: Emergency Medicine

## 2019-09-12 ENCOUNTER — Other Ambulatory Visit: Payer: Self-pay

## 2019-09-12 DIAGNOSIS — L02416 Cutaneous abscess of left lower limb: Secondary | ICD-10-CM | POA: Insufficient documentation

## 2019-09-12 DIAGNOSIS — L0291 Cutaneous abscess, unspecified: Secondary | ICD-10-CM

## 2019-09-12 DIAGNOSIS — R2242 Localized swelling, mass and lump, left lower limb: Secondary | ICD-10-CM | POA: Diagnosis present

## 2019-09-12 DIAGNOSIS — Z79899 Other long term (current) drug therapy: Secondary | ICD-10-CM | POA: Insufficient documentation

## 2019-09-12 MED ORDER — IBUPROFEN 800 MG PO TABS: 800.0000 mg | ORAL_TABLET | Freq: Three times a day (TID) | ORAL | 0 refills | Status: DC

## 2019-09-12 MED ORDER — SULFAMETHOXAZOLE-TRIMETHOPRIM 800-160 MG PO TABS: 1.0000 | ORAL_TABLET | Freq: Two times a day (BID) | ORAL | 0 refills | Status: AC

## 2019-09-12 MED ORDER — MUPIROCIN CALCIUM 2 % NA OINT: TOPICAL_OINTMENT | NASAL | 0 refills | Status: DC

## 2019-09-12 NOTE — ED Provider Notes (Signed)
[facility] Provider Note   CSN: [id number] Arrival date & time: 09/12/19  1508     History Chief Complaint  Patient presents with  . Abscess    Eric Underwood is a 29 y.o. male.  HPI      Eric Underwood is a 29 y.o. male who presents to the Emergency Department complaining of an area of pain and swelling to the left upper thigh.  He states he noticed a "pimple" to the area several days ago.  He has squeezed on the area and states it drained a small amount of pus and blood.  Since then he reports increasing tenderness.  Pain is worse with palpation and weightbearing.  He denies fever, chills, numbness of the extremity and redness.  No history of MRSA.  History reviewed. No pertinent past medical history.  There are no problems to display for this patient.   History reviewed. No pertinent surgical history.     History reviewed. No pertinent family history.  Social History   Tobacco Use  . Smoking status: Never Smoker  . Smokeless tobacco: Never Used  Substance Use Topics  . Alcohol use: No  . Drug use: No    Home Medications Prior to Admission medications   Medication Sig Start Date End Date Taking? Authorizing Provider  ciprofloxacin (CIPRO) 500 MG tablet Take 1 tablet (500 mg total) by mouth 2 (two) times daily. 06/27/17   Long, [facility], MD  lidocaine-hydrocortisone (ANAMANTLE) 3-1 % KIT Place 1 application rectally 2 (two) times daily. 08/13/18   Evalee Jefferson, PA-C  loperamide (IMODIUM) 2 MG capsule Take 1 capsule (2 mg total) by mouth 4 (four) times daily as needed for diarrhea or loose stools. 06/27/17   Long, [facility], MD  ondansetron (ZOFRAN ODT) 4 MG disintegrating tablet Take 1 tablet (4 mg total) by mouth every 8 (eight) hours as needed for nausea or vomiting. 06/27/17   Long, [facility], MD    Allergies    Penicillins  Review of Systems   Review of Systems  Constitutional: Negative for chills and fever.  Gastrointestinal:  Negative for abdominal pain, nausea and vomiting.  Musculoskeletal: Negative for arthralgias and joint swelling.  Skin: Positive for wound. Negative for color change.       Focal area of pain, swelling to the left thigh  Neurological: Negative for weakness and numbness.  Hematological: Negative for adenopathy.    Physical Exam Updated Vital Signs BP 133/82 (BP Location: Right Arm)   Pulse 95   Temp 98.9 F (37.2 C) (Oral)   Resp 16   Ht '6\' 1"'  (1.854 m)   Wt 85.7 kg   SpO2 100%   BMI 24.94 kg/m   Physical Exam Vitals and nursing note reviewed.  Constitutional:      Appearance: Normal appearance. He is not ill-appearing or toxic-appearing.  Cardiovascular:     Rate and Rhythm: Normal rate and regular rhythm.     Pulses: Normal pulses.  Pulmonary:     Effort: Pulmonary effort is normal.  Chest:     Chest wall: No tenderness.  Musculoskeletal:        General: Normal range of motion.  Skin:    General: Skin is warm.     Capillary Refill: Capillary refill takes less than 2 seconds.     Findings: No erythema.     Comments: 3 cm open papule to mid upper left thigh. No significant drainage or erythema.  No lymphangitis.  Neurological:     General: No focal deficit present.     Mental Status: He is alert.     Sensory: No sensory deficit.     Motor: No weakness.     ED Results / Procedures / Treatments   Labs (all labs ordered are listed, but only abnormal results are displayed) Labs Reviewed - No data to display  EKG None  Radiology No results found.  Procedures Procedures (including critical care time)  Medications Ordered in ED Medications - No data to display  ED Course  I have reviewed the triage vital signs and the nursing notes.  Pertinent labs & imaging results that were available during my care of the patient were reviewed by me and considered in my medical decision making (see chart for details).    MDM Rules/Calculators/A&P                       Open papule to the left upper thigh w/o evidence of cellulitis. Increased tenderness likely to be related to traumatic from squeezing.  No indication for I&D at this time.  Pt agrees to warm soaks, abx and close f/u.  Return precautions discussed.    Final Clinical Impression(s) / ED Diagnoses Final diagnoses:  Abscess    Rx / DC Orders ED Discharge Orders    None       Kem Parkinson, PA-C 09/12/19 1756    Fredia Sorrow, MD 09/13/19 [phone]

## 2019-09-12 NOTE — ED Triage Notes (Signed)
Patient reports abscess on L upper leg, started on Friday.

## 2019-09-12 NOTE — Discharge Instructions (Signed)
Warm water soaks or compresses 2-3 times a day.  Take the antibiotic as directed until its finished.  You may take the prescription ibuprofen if needed for pain.  Return to the emergency department if you develop any worsening symptoms such as fever, chills, increasing pain or swelling to the site

## 2019-10-10 ENCOUNTER — Ambulatory Visit: Payer: BLUE CROSS/BLUE SHIELD | Attending: Internal Medicine

## 2019-10-10 DIAGNOSIS — Z23 Encounter for immunization: Secondary | ICD-10-CM

## 2019-10-10 NOTE — Progress Notes (Signed)
   Covid-19 Vaccination Clinic  Name:  Eric Underwood    MRN: [id number] DOB: 11/28/1990  10/10/2019  [name] was observed post Covid-19 immunization for 15 minutes without incident. He was provided with Vaccine Information Sheet and instruction to access the V-Safe system.   Mr. Mathews was instructed to call 911 with any severe reactions post vaccine: Marland Kitchen Difficulty breathing  . Swelling of face and throat  . A fast heartbeat  . A bad rash all over body  . Dizziness and weakness   Immunizations Administered    Name Date Dose VIS Date Route   Moderna COVID-19 Vaccine 10/10/2019  8:49 AM 0.5 mL 06/18/2019 Intramuscular   Manufacturer: Moderna   Lot: [id number]   NDC: 45364-680-32

## 2019-11-12 ENCOUNTER — Ambulatory Visit: Payer: BLUE CROSS/BLUE SHIELD | Attending: Internal Medicine

## 2019-11-12 DIAGNOSIS — Z23 Encounter for immunization: Secondary | ICD-10-CM

## 2019-11-12 NOTE — Progress Notes (Signed)
   Covid-19 Vaccination Clinic  Name:  Eric Underwood    MRN: [id number] DOB: 04/20/1991  11/12/2019  [name] was observed post Covid-19 immunization for 15 minutes without incident. He was provided with Vaccine Information Sheet and instruction to access the V-Safe system.   [name] was instructed to call 911 with any severe reactions post vaccine: Marland Kitchen Difficulty breathing  . Swelling of face and throat  . A fast heartbeat  . A bad rash all over body  . Dizziness and weakness   Immunizations Administered    Name Date Dose VIS Date Route   Moderna COVID-19 Vaccine 11/12/2019  8:04 AM 0.5 mL 06/2019 Intramuscular   Manufacturer: Moderna   Lot: [id number]   NDC: 90172-419-54

## 2021-07-08 DIAGNOSIS — Z6824 Body mass index (BMI) 24.0-24.9, adult: Secondary | ICD-10-CM | POA: Diagnosis not present

## 2021-07-08 DIAGNOSIS — Z114 Encounter for screening for human immunodeficiency virus [HIV]: Secondary | ICD-10-CM | POA: Diagnosis not present

## 2021-07-08 DIAGNOSIS — Z0000 Encounter for general adult medical examination without abnormal findings: Secondary | ICD-10-CM | POA: Diagnosis not present

## 2021-07-22 ENCOUNTER — Telehealth: Payer: Self-pay

## 2021-07-22 ENCOUNTER — Other Ambulatory Visit (HOSPITAL_COMMUNITY): Payer: Self-pay

## 2021-07-22 NOTE — Telephone Encounter (Signed)
[facility] Patient Product/process development scientist completed.    The patient is insured through [employer] and has a $100.00 copay.  Patient will need a copay coupon card.  We will continue to follow to see if copay assistance is needed.  Clearance Coots, CPhT Specialty Pharmacy Patient [facility] Phone: [phone] Fax:  [phone]

## 2021-07-23 ENCOUNTER — Other Ambulatory Visit (HOSPITAL_COMMUNITY): Payer: Self-pay

## 2021-07-23 ENCOUNTER — Ambulatory Visit (INDEPENDENT_AMBULATORY_CARE_PROVIDER_SITE_OTHER): Payer: BC Managed Care – PPO | Admitting: Infectious Diseases

## 2021-07-23 ENCOUNTER — Other Ambulatory Visit: Payer: Self-pay

## 2021-07-23 ENCOUNTER — Telehealth: Payer: Self-pay

## 2021-07-23 ENCOUNTER — Other Ambulatory Visit (HOSPITAL_COMMUNITY)
Admission: RE | Admit: 2021-07-23 | Discharge: 2021-07-23 | Disposition: A | Discharge status: Home / Self Care | Payer: BC Managed Care – PPO | Source: Ambulatory Visit | Attending: Infectious Diseases | Admitting: Infectious Diseases

## 2021-07-23 ENCOUNTER — Ambulatory Visit: Payer: BC Managed Care – PPO | Admitting: Pharmacist

## 2021-07-23 VITALS — BP 148/85 | HR 111 | Resp 16 | Ht 73.0 in | Wt 184.4 lb

## 2021-07-23 DIAGNOSIS — B20 Human immunodeficiency virus [HIV] disease: Secondary | ICD-10-CM | POA: Diagnosis not present

## 2021-07-23 DIAGNOSIS — A549 Gonococcal infection, unspecified: Secondary | ICD-10-CM

## 2021-07-23 DIAGNOSIS — Z23 Encounter for immunization: Secondary | ICD-10-CM | POA: Diagnosis not present

## 2021-07-23 DIAGNOSIS — Z7185 Encounter for immunization safety counseling: Secondary | ICD-10-CM

## 2021-07-23 DIAGNOSIS — Z113 Encounter for screening for infections with a predominantly sexual mode of transmission: Secondary | ICD-10-CM | POA: Diagnosis not present

## 2021-07-23 DIAGNOSIS — A749 Chlamydial infection, unspecified: Secondary | ICD-10-CM | POA: Diagnosis not present

## 2021-07-23 MED ORDER — BICTEGRAVIR-EMTRICITAB-TENOFOV 50-200-25 MG PO TABS
1.0000 | ORAL_TABLET | Freq: Every day | ORAL | 5 refills | Status: DC
  Filled 2021-07-23 (×2): qty 30, 30d supply, fill #0
  Filled 2021-08-13 – 2021-08-19 (×2): qty 30, 30d supply, fill #1
  Filled 2021-09-22: qty 30, 30d supply, fill #2
  Filled 2021-10-12: qty 30, 30d supply, fill #3
  Filled 2021-11-10: qty 30, 30d supply, fill #4
  Filled 2021-12-14: qty 30, 30d supply, fill #5

## 2021-07-23 NOTE — Progress Notes (Addendum)
[facility], [place], [place], [address]                                                                  Phn. [phone]; Fax: [phone]                                                                             Date: 07/23/21  Reason for Visit: HIV Rapid start    HPI: Eric Underwood is a 31 y.o.old male ( MSM) who is seen in the clinic for rapid start of HIV. He was tested for HIV for PrEP on 12/27 and was tested to be positive. He has been sexually active with a male partner with whom he has been for last 3 months. Had a h/o Gonorrhea and chlamydia when he was [age] years ( treated). 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 Declines Flu vaccine He is also willing to get PCV 13 vaccine. He follows with Dentist Feels well. No complaints otherwise.   07/13/21 ( [place] ) HIV ab/p 24 ag reactive, HIV ab reactive   ROS: Denies dysphagia, odynophagia, cough, fever, nausea, vomiting, diarrhea, constipation, weight loss, chills, night sweats, recent hospitalizations, rashes, joint complaints, shortness of breath, headaches, chest pain, abdominal pain, dysuria .                                       Allergies  Allergen Reactions   Penicillins     Has patient had a PCN reaction causing immediate rash, facial/tongue/throat swelling, SOB or lightheadedness with hypotension: Unknown Has patient had a PCN reaction causing severe rash involving mucus membranes or skin necrosis: Unknown Has patient had a PCN reaction that required  hospitalization: Unknown Has patient had a PCN reaction occurring within the last 10 years: Unknown If all of the above answers are "NO", then may proceed with Cephalosporin use.    PMH - denies PSH- denies Medications - none  Social History   Socioeconomic History   Marital status: Single    Spouse name: Not on file   Number of children: Not on file   Years of education: Not on file   Highest education level: Not on file  Occupational History   Not on file  Tobacco Use   Smoking status: Never   Smokeless  tobacco: Never  Vaping Use   Vaping Use: Never used  Substance and Sexual Activity   Alcohol use: No   Drug use: No   Sexual activity: Not on file  Other Topics Concern   Not on file  Social History Narrative   Not on file   Social Determinants of Health   Financial Resource Strain: Not on file  Food Insecurity: Not on file  Transportation Needs: Not on file  Physical Activity: Not on file  Stress: Not on file  Social Connections: Not on file  Intimate Partner Violence: Not on file   No family history on file.  Vitals  BP (!) 148/85    Pulse (!) 111    Resp 16    Ht '6\' 1"'  (1.854 m)    Wt 184 lb 6.4 oz (83.6 kg)    SpO2 100%    BMI 24.33 kg/m   Examination  Gen: Alert and oriented x 3, no acute distress HEENT: [place]/AT, no scleral icterus, no pale conjunctivae, hearing normal, oral mucosa moist Neck: Supple Cardio: Regular rate and rhythm; +S1 and S2 Resp: CTAB GI: Soft, nontender, nondistended GU: Musc: Extremities: No cyanosis, clubbing, or edema Skin: No rashes, lesions, or ecchymoses Neuro: grossly non focal  Psych: Calm, cooperative  Lab Results No results found for: HIV1RNAQUANT, HIV1RNAVL, CD4TABS No results found for: HIV1GENOSEQ Lab Results  Component Value Date   WBC 12.1 (H) 06/27/2017   HGB 16.1 06/27/2017   HCT 47.2 06/27/2017   MCV 88.7 06/27/2017   PLT 207 06/27/2017    Lab Results  Component Value Date   CREATININE 1.13  06/27/2017   BUN 18 06/27/2017   NA 129 (L) 06/27/2017   K 3.7 06/27/2017   CL 96 (L) 06/27/2017   CO2 22 06/27/2017   Lab Results  Component Value Date   ALT 28 06/27/2017   AST 48 (H) 06/27/2017   ALKPHOS 52 06/27/2017   BILITOT 1.1 06/27/2017    No results found for: CHOL, TRIG, HDL, LDLCALC No results found for: HAV No results found for: HEPBSAG, HEPBSAB No results found for: HCVAB No results found for: CHLAMYDIAWP, N No results found for: GCPROBEAPT No results found for: [place] Maintenance: Immunization History  Administered Date(s) Administered   Moderna Sars-Covid-2 Vaccination 10/10/2019, 11/12/2019   Problem List Items Addressed This Visit       Other   HIV disease ([place]) - Primary   Relevant Medications   bictegravir-emtricitabine-tenofovir AF (BIKTARVY) 50-200-25 MG TABS tablet   Other Relevant Orders   CBC   Comprehensive metabolic panel   HIV-1 RNA ultraquant reflex to gentyp+   RPR   T-helper cell (CD4)- ([facility] clinic only)   Urine cytology ancillary only   Hepatitis B core antibody, total   Hepatitis B surface antibody,qualitative   Hepatitis B surface antigen   Hepatitis C antibody   HLA B*5701   QuantiFERON-TB Gold Plus   Cytology (oral, anal, urethral) ancillary only   Cytology (oral, anal, urethral) ancillary only   Gonorrhea   Relevant Medications   bictegravir-emtricitabine-tenofovir AF (BIKTARVY) 50-200-25 MG TABS tablet   Chlamydia   Relevant Medications   bictegravir-emtricitabine-tenofovir AF (BIKTARVY) 50-200-25 MG TABS tablet   Screening for STDs (sexually transmitted diseases)   Immunization counseling    Assessment/Plan: # HIV, 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  Discussed the severity of untreated HIV including higher cancer risk, opportunistic infections, renal  failure.  Discussed needing to use condoms, partner disclosure,  necessary vaccines, blood monitoring.     Start Biktarvy one tab daily, meds sent Fu in 6 weeks for VL Intake labs today   Orders Placed This Encounter  Procedures   CBC   Comprehensive metabolic panel   HIV-1 RNA ultraquant reflex to gentyp+   RPR   T-helper cell (CD4)- ([facility] clinic only)   Hepatitis B core antibody, total   Hepatitis B surface antibody,qualitative   Hepatitis B surface antigen   Hepatitis C antibody   HLA B*5701   QuantiFERON-TB Gold Plus   # H/o Gonorrhea/Chlamydia, treated   # STD Screening  Urine, oral and anal GC and RPR  #Immunization  COVID- received 2 doses of COVID vaccine and 2 boosters per patient Influenza- declines  Pneumococcal PCV 13 today  Meningitis HepA/HEpB- check serology  Tdap Shingles  #Health maintenance -Quantiferon- check today  -Follows with dentist   Patient's labs were reviewed as well as his previous records. Patients questions were addressed and answered. Safe sex counseling done.   I have personally spent 60 minutes involved in face-to-face and non-face-to-face activities for this patient on the day of the visit. Professional time spent includes the following activities: Preparing to see the patient (review of tests), Obtaining and/or reviewing separately obtained history (admission/discharge record), Performing a medically appropriate examination and/or evaluation , Ordering medications/tests/procedures, referring and communicating with other health care professionals, Documenting clinical information in the EMR, Independently interpreting results (not separately reported), Communicating results to the patient/family/caregiver, Counseling and educating the patient/family/caregiver and Care coordination (not separately reported).    Electronically signed by:  Rosiland Oz, MD Infectious Disease Physician [facility] [address] [place], [place]  [address] Phone: [phone]   Fax: [phone]'

## 2021-07-23 NOTE — Telephone Encounter (Signed)
[facility] Patient Advocate Encounter °  °Was successful in obtaining a [employer] copay card for Biktarvy.  This copay card will make the patients copay 0.00. ° °I have spoken with the patient.   ° °The billing information is as follows and has been shared with [facility]. ° ° ° ° ° ° ° °Eric Underwood, CPhT °Specialty Pharmacy Patient Advocate °[facility] °Phone: [phone] °Fax:  [phone]  °

## 2021-07-23 NOTE — Progress Notes (Signed)
HPI: Eric Underwood is a 31 y.o. male who presents to the [facility] clinic today to initiate care for a newly diagnosed HIV infection.  Patient Active Problem List   Diagnosis Date Noted   HIV disease ([place]) 07/23/2021   Gonorrhea 07/23/2021   Chlamydia 07/23/2021   Screening for STDs (sexually transmitted diseases) 07/23/2021   Immunization counseling 07/23/2021    Patient's Medications  New Prescriptions   No medications on file  Previous Medications   BICTEGRAVIR-EMTRICITABINE-TENOFOVIR AF (BIKTARVY) 50-200-25 MG TABS TABLET    Take 1 tablet by mouth daily.   CIPROFLOXACIN (CIPRO) 500 MG TABLET    Take 1 tablet (500 mg total) by mouth 2 (two) times daily.   LIDOCAINE-HYDROCORTISONE (ANAMANTLE) 3-1 % KIT    Place 1 application rectally 2 (two) times daily.   LOPERAMIDE (IMODIUM) 2 MG CAPSULE    Take 1 capsule (2 mg total) by mouth 4 (four) times daily as needed for diarrhea or loose stools.   MUPIROCIN NASAL OINTMENT (BACTROBAN) 2 %    Apply to the affected area TID x 10 days   ONDANSETRON (ZOFRAN ODT) 4 MG DISINTEGRATING TABLET    Take 1 tablet (4 mg total) by mouth every 8 (eight) hours as needed for nausea or vomiting.  Modified Medications   No medications on file  Discontinued Medications   No medications on file    Allergies: Allergies  Allergen Reactions   Penicillins     Has patient had a PCN reaction causing immediate rash, facial/tongue/throat swelling, SOB or lightheadedness with hypotension: Unknown Has patient had a PCN reaction causing severe rash involving mucus membranes or skin necrosis: Unknown Has patient had a PCN reaction that required hospitalization: Unknown Has patient had a PCN reaction occurring within the last 10 years: Unknown If all of the above answers are "NO", then may proceed with Cephalosporin use.     Past Medical History: No past medical history on file.  Social History: Social History   Socioeconomic History   Marital status: Single     Spouse name: Not on file   Number of children: Not on file   Years of education: Not on file   Highest education level: Not on file  Occupational History   Not on file  Tobacco Use   Smoking status: Never   Smokeless tobacco: Never  Vaping Use   Vaping Use: Never used  Substance and Sexual Activity   Alcohol use: No   Drug use: No   Sexual activity: Not on file  Other Topics Concern   Not on file  Social History Narrative   Not on file   Social Determinants of Health   Financial Resource Strain: Not on file  Food Insecurity: Not on file  Transportation Needs: Not on file  Physical Activity: Not on file  Stress: Not on file  Social Connections: Not on file    Labs: No results found for: HIV1RNAQUANT, HIV1RNAVL, CD4TABS  RPR and STI No results found for: LABRPR, RPRTITER  No flowsheet data found.  Hepatitis B No results found for: HEPBSAB, HEPBSAG, HEPBCAB Hepatitis C No results found for: HEPCAB, HCVRNAPCRQN Hepatitis A No results found for: HAV Lipids: No results found for: CHOL, TRIG, HDL, CHOLHDL, VLDL, LDLCALC  Current HIV Regimen: Treatment naive  Assessment: Eric Underwood is here today to initiate care with Dr. West Bali for his newly diagnosed HIV infection.  He is treatment naive with an unknown initial HIV viral load and CD4 count as he is a rapid  start today. Will start patient on [place]. Patient requires copay card given elevated copay of $100; Butch Penny completed and will send prescription to [facility]. Provided patient with 7-day sample to get him through the weekend.  Plan: Start Biktarvy  Alfonse Spruce, PharmD, CPP Clinical Pharmacist Practitioner Infectious Diseases [facility] 07/23/2021, 9:22 AM

## 2021-07-23 NOTE — Addendum Note (Signed)
Addended by: Odette Fraction on: 07/23/2021 12:49 PM   Modules accepted: Orders

## 2021-07-24 ENCOUNTER — Other Ambulatory Visit (HOSPITAL_COMMUNITY): Payer: Self-pay

## 2021-07-24 LAB — URINE CYTOLOGY ANCILLARY ONLY
Chlamydia: NEGATIVE
Comment: NEGATIVE
Comment: NORMAL
Neisseria Gonorrhea: NEGATIVE

## 2021-07-26 ENCOUNTER — Other Ambulatory Visit: Payer: Self-pay | Admitting: Infectious Diseases

## 2021-07-26 ENCOUNTER — Other Ambulatory Visit: Payer: Self-pay | Admitting: Pharmacist

## 2021-07-26 ENCOUNTER — Telehealth: Payer: Self-pay

## 2021-07-26 ENCOUNTER — Other Ambulatory Visit (HOSPITAL_COMMUNITY): Payer: Self-pay

## 2021-07-26 DIAGNOSIS — B20 Human immunodeficiency virus [HIV] disease: Secondary | ICD-10-CM

## 2021-07-26 MED ORDER — SULFAMETHOXAZOLE-TRIMETHOPRIM 400-80 MG PO TABS
1.0000 | ORAL_TABLET | Freq: Two times a day (BID) | ORAL | 2 refills | Status: DC
  Filled 2021-07-26: qty 30, 15d supply, fill #0

## 2021-07-26 MED ORDER — BIKTARVY 50-200-25 MG PO TABS: 1.0000 | ORAL_TABLET | Freq: Every day | ORAL | 0 refills | Status: AC

## 2021-07-26 MED ORDER — SULFAMETHOXAZOLE-TRIMETHOPRIM 400-80 MG PO TABS
1.0000 | ORAL_TABLET | Freq: Every day | ORAL | 2 refills | Status: DC
  Filled 2021-07-26 (×2): qty 30, 30d supply, fill #0
  Filled 2021-09-03: qty 30, 30d supply, fill #1

## 2021-07-26 NOTE — Progress Notes (Signed)
Medication Samples have been provided to the patient. ° °Drug name: Biktarvy        °Strength: 50/200/25 mg       °Qty: 7 tablets (1 bottle)   °LOT: [id number]   °Exp.Date: 04/18/2023 ° °Dosing instructions: Take one tablet by mouth once daily ° °The patient has been instructed regarding the correct time, dose, and frequency of taking this medication, including desired effects and most common side effects.  ° °Iasha Mccalister L. Sameul Tagle, PharmD, BCIDP, AAHIVP, CPP °Clinical Pharmacist Practitioner °Infectious Diseases Clinical Pharmacist °[facility] °06/29/2020, 10:07 AM  °

## 2021-07-26 NOTE — Telephone Encounter (Signed)
Spoke with patient, notified him of low CD4 count. Discussed what this means and how CD4 is expected to rebound as he continues to suppress his viral load with daily Biktarvy.   Relayed that a medication called Bactrim was sent in for him to take once daily to help protect him from opportunistic pneumonia. Advised him to start the Bactrim when he picks it up from the pharmacy. Patient verbalized understanding and has no further questions.   Sandie Ano, RN

## 2021-07-26 NOTE — Telephone Encounter (Signed)
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 Sent to [facility]  Please let him know to start it as soon as he gets the meds.

## 2021-07-27 ENCOUNTER — Other Ambulatory Visit: Payer: Self-pay | Admitting: Infectious Diseases

## 2021-07-27 ENCOUNTER — Encounter: Payer: Self-pay | Admitting: Infectious Diseases

## 2021-07-27 DIAGNOSIS — B181 Chronic viral hepatitis B without delta-agent: Secondary | ICD-10-CM

## 2021-07-27 NOTE — Telephone Encounter (Signed)
Spoke with patient, relayed positive Hep B status and discussed that this is a treatable condition. Scheduled patient for labs next week and advised that treatment will be discussed once those results are received. Patient verbalized understanding and has no further questions.   Sandie Ano, RN

## 2021-07-30 LAB — COMPREHENSIVE METABOLIC PANEL
AG Ratio: 1 (calc) (ref 1.0–2.5)
ALT: 19 U/L (ref 9–46)
AST: 31 U/L (ref 10–40)
Albumin: 4.2 g/dL (ref 3.6–5.1)
Alkaline phosphatase (APISO): 63 U/L (ref 36–130)
BUN: 7 mg/dL (ref 7–25)
CO2: 27 mmol/L (ref 20–32)
Calcium: 9.4 mg/dL (ref 8.6–10.3)
Chloride: 103 mmol/L (ref 98–110)
Creat: 0.91 mg/dL (ref 0.60–1.24)
Globulin: 4.4 g/dL (calc) — ABNORMAL HIGH (ref 1.9–3.7)
Glucose, Bld: 81 mg/dL (ref 65–99)
Potassium: 4.1 mmol/L (ref 3.5–5.3)
Sodium: 139 mmol/L (ref 135–146)
Total Bilirubin: 0.4 mg/dL (ref 0.2–1.2)
Total Protein: 8.6 g/dL — ABNORMAL HIGH (ref 6.1–8.1)

## 2021-07-30 LAB — HEPATITIS C ANTIBODY
Hepatitis C Ab: NONREACTIVE
SIGNAL TO CUT-OFF: 0.06 (ref <1.00)

## 2021-07-30 LAB — QUANTIFERON-TB GOLD PLUS
Mitogen-NIL: 2.65 IU/mL
NIL: 0.04 IU/mL
QuantiFERON-TB Gold Plus: NEGATIVE
TB1-NIL: 0 IU/mL
TB2-NIL: 0.01 IU/mL

## 2021-07-30 LAB — CBC
HCT: 47.9 % (ref 38.5–50.0)
Hemoglobin: 16.3 g/dL (ref 13.2–17.1)
MCH: 31.5 pg (ref 27.0–33.0)
MCHC: 34 g/dL (ref 32.0–36.0)
MCV: 92.5 fL (ref 80.0–100.0)
MPV: 11 fL (ref 7.5–12.5)
Platelets: 267 10*3/uL (ref 140–400)
RBC: 5.18 10*6/uL (ref 4.20–5.80)
RDW: 12.3 % (ref 11.0–15.0)
WBC: 4.9 10*3/uL (ref 3.8–10.8)

## 2021-07-30 LAB — T-HELPER CELLS (CD4) COUNT (NOT AT ARMC)
Absolute CD4: 201 cells/uL — ABNORMAL LOW (ref 490–1740)
CD4 T Helper %: 13 % — ABNORMAL LOW (ref 30–61)
Total lymphocyte count: 1609 cells/uL (ref 850–3900)

## 2021-07-30 LAB — HEPATITIS B CORE ANTIBODY, TOTAL: Hep B Core Total Ab: NONREACTIVE

## 2021-07-30 LAB — HEPATITIS B SURFACE ANTIGEN
Confirmation: REACTIVE — AB
Hepatitis B Surface Ag: REACTIVE — AB

## 2021-07-30 LAB — HIV-1 RNA ULTRAQUANT REFLEX TO GENTYP+
HIV 1 RNA Quant: 83000 copies/mL — ABNORMAL HIGH
HIV-1 RNA Quant, Log: 4.92 Log copies/mL — ABNORMAL HIGH

## 2021-07-30 LAB — HIV-1 GENOTYPE: HIV-1 Genotype: DETECTED — AB

## 2021-07-30 LAB — HLA B*5701: HLA-B*5701 w/rflx HLA-B High: NEGATIVE

## 2021-07-30 LAB — RPR: RPR Ser Ql: NONREACTIVE

## 2021-07-30 LAB — HEPATITIS B SURFACE ANTIBODY,QUALITATIVE: Hep B S Ab: NONREACTIVE

## 2021-08-05 ENCOUNTER — Ambulatory Visit (HOSPITAL_COMMUNITY): Payer: BC Managed Care – PPO

## 2021-08-05 ENCOUNTER — Encounter (HOSPITAL_COMMUNITY): Payer: Self-pay

## 2021-08-05 ENCOUNTER — Other Ambulatory Visit: Payer: Self-pay

## 2021-08-05 ENCOUNTER — Other Ambulatory Visit: Payer: BC Managed Care – PPO

## 2021-08-05 DIAGNOSIS — B181 Chronic viral hepatitis B without delta-agent: Secondary | ICD-10-CM

## 2021-08-11 LAB — HEPATITIS B DNA, ULTRAQUANTITATIVE, PCR
Hepatitis B DNA (Calc): 2.01 Log IU/mL — ABNORMAL HIGH
Hepatitis B DNA: 103 IU/mL — ABNORMAL HIGH

## 2021-08-11 LAB — HEPATITIS B E ANTIGEN: Hep B E Ag: NONREACTIVE

## 2021-08-11 LAB — HEPATITIS B E ANTIBODY: Hep B E Ab: NONREACTIVE

## 2021-08-11 LAB — HEPATITIS B CORE ANTIBODY, IGM: Hep B C IgM: NONREACTIVE

## 2021-08-11 LAB — HEPATITIS DELTA ANTIBODY: Hepatitis D Ab, Total: NEGATIVE

## 2021-08-13 ENCOUNTER — Other Ambulatory Visit (HOSPITAL_COMMUNITY): Payer: Self-pay

## 2021-08-19 ENCOUNTER — Other Ambulatory Visit (HOSPITAL_COMMUNITY): Payer: Self-pay

## 2021-08-20 ENCOUNTER — Other Ambulatory Visit (HOSPITAL_COMMUNITY): Payer: Self-pay

## 2021-08-25 ENCOUNTER — Other Ambulatory Visit (HOSPITAL_COMMUNITY): Payer: Self-pay

## 2021-08-27 ENCOUNTER — Other Ambulatory Visit (HOSPITAL_COMMUNITY): Payer: Self-pay

## 2021-09-03 ENCOUNTER — Ambulatory Visit (INDEPENDENT_AMBULATORY_CARE_PROVIDER_SITE_OTHER): Payer: Commercial Managed Care - PPO | Admitting: Infectious Diseases

## 2021-09-03 ENCOUNTER — Encounter: Payer: Self-pay | Admitting: Infectious Diseases

## 2021-09-03 ENCOUNTER — Other Ambulatory Visit (HOSPITAL_COMMUNITY): Payer: Self-pay

## 2021-09-03 ENCOUNTER — Other Ambulatory Visit: Payer: Self-pay

## 2021-09-03 VITALS — BP 140/81 | HR 101 | Temp 99.2°F | Wt 183.0 lb

## 2021-09-03 DIAGNOSIS — B181 Chronic viral hepatitis B without delta-agent: Secondary | ICD-10-CM

## 2021-09-03 DIAGNOSIS — Z5181 Encounter for therapeutic drug level monitoring: Secondary | ICD-10-CM | POA: Diagnosis not present

## 2021-09-03 DIAGNOSIS — Z7185 Encounter for immunization safety counseling: Secondary | ICD-10-CM | POA: Diagnosis not present

## 2021-09-03 DIAGNOSIS — B20 Human immunodeficiency virus [HIV] disease: Secondary | ICD-10-CM

## 2021-09-03 MED ORDER — SULFAMETHOXAZOLE-TRIMETHOPRIM 400-80 MG PO TABS
1.0000 | ORAL_TABLET | Freq: Every day | ORAL | 2 refills | Status: DC
  Filled 2021-09-03 – 2021-09-22 (×2): qty 30, 30d supply, fill #0
  Filled 2021-10-22: qty 30, 30d supply, fill #1
  Filled 2021-12-20: qty 30, 30d supply, fill #2

## 2021-09-03 NOTE — Addendum Note (Signed)
Addended by: Odette Fraction on: 09/03/2021 06:46 PM   Modules accepted: Orders

## 2021-09-03 NOTE — Progress Notes (Addendum)
[address], [place], [place], [address]                                                                  Phn. [phone]; Fax: [phone]                                                                             Date: 09/03/21  Reason for Visit: Routine HIV care.  HPI: Eric Underwood is a 31 y.o.old male with a history of HIV.  Last seen on 07/23/21. Currently on Biktarvy   Interval hx/current visit: Taking Biktarvy daily without missed doses. Denies side effects. Sexually active with the same male partner. Denies alcohol, smoking and illicit drugs. He declined flu vaccine and Monkey pox. He received bivalent covid booster from [facility] this morning. Continues to work as a Naval architect. He follows with a dentist in [place]. He has stopped taking bactrim taken last on 2/9. No other complains.  ROS: As stated in above HPI; all other systems were reviewed and are otherwise negative unless noted below  No reported fever / chills, night sweats, unintentional weight loss, acute visual change, odynophagia, chest pain/pressure, new or worsened SOB or WOB, nausea, vomiting, diarrhea, dysuria, GU discharge, syncope, seizures, red/hot swollen joints, hallucinations / delusions, rashes, new allergies, unusual / excessive bleeding, swollen lymph nodes, or new hospitalizations/ED visits/Urgent Care visits since the pt was last seen.  PMH/ PSH/ FamHx / Social Hx , medications and allergies reviewed and updated as appropriate; please see corresponding tab in EHR / prior notes                                        Current Outpatient Medications on File Prior to Visit  Medication Sig Dispense Refill   bictegravir-emtricitabine-tenofovir AF (BIKTARVY) 50-200-25 MG TABS tablet Take 1 tablet by mouth  daily. 30 tablet 5   sulfamethoxazole-trimethoprim (BACTRIM) 400-80 MG tablet Take 1 tablet by mouth daily. (Patient not taking: Reported on 09/03/2021) 30 tablet 2   No current facility-administered medications on file prior to visit.     Allergies  Allergen Reactions   Penicillins     Has patient had a PCN reaction causing immediate rash, facial/tongue/throat swelling, SOB or lightheadedness with hypotension: Unknown Has patient had a PCN reaction causing severe rash involving mucus membranes or skin necrosis: Unknown Has patient had a PCN reaction that required hospitalization: Unknown Has patient had a PCN reaction occurring within the last 10 years: Unknown If all of the above answers are "NO", then  may proceed with Cephalosporin use.    Social History   Socioeconomic History   Marital status: Single    Spouse name: Not on file   Number of children: Not on file   Years of education: Not on file   Highest education level: Not on file  Occupational History   Not on file  Tobacco Use   Smoking status: Never   Smokeless tobacco: Never  Vaping Use   Vaping Use: Never used  Substance and Sexual Activity   Alcohol use: No   Drug use: No   Sexual activity: Not on file  Other Topics Concern   Not on file  Social History Narrative   Not on file   Social Determinants of Health   Financial Resource Strain: Not on file  Food Insecurity: Not on file  Transportation Needs: Not on file  Physical Activity: Not on file  Stress: Not on file  Social Connections: Not on file  Intimate Partner Violence: Not on file   No family history on file.   Vitals  BP 140/81    Pulse (!) 101    Temp 99.2 F (37.3 C) (Oral)    Wt 183 lb (83 kg)    BMI 24.14 kg/m    Examination  Gen: Alert and oriented x 3, no acute distress HEENT: [place]/AT, no scleral icterus, no pale conjunctivae, hearing normal, oral mucosa moist Neck: Supple Cardio: Regular rate and rhythm; +S1 and S2 Resp:  CTAB GI: nondistended GU: Musc: Extremities: No cyanosis, clubbing, or edema Skin: No rashes, lesions, or ecchymoses Neuro: grossly non focal  Psych: Calm, cooperative    Lab Results HIV 1 RNA Quant (copies/mL)  Date Value  07/23/2021 83,000 (H)   No results found for: HIV1GENOSEQ Lab Results  Component Value Date   WBC 4.9 07/23/2021   HGB 16.3 07/23/2021   HCT 47.9 07/23/2021   MCV 92.5 07/23/2021   PLT 267 07/23/2021    Lab Results  Component Value Date   CREATININE 0.91 07/23/2021   BUN 7 07/23/2021   NA 139 07/23/2021   K 4.1 07/23/2021   CL 103 07/23/2021   CO2 27 07/23/2021   Lab Results  Component Value Date   ALT 19 07/23/2021   AST 31 07/23/2021   ALKPHOS 52 06/27/2017   BILITOT 0.4 07/23/2021    No results found for: CHOL, TRIG, HDL, LDLCALC No results found for: HAV Lab Results  Component Value Date   HEPBSAG REACTIVE (A) 07/23/2021   HEPBSAB NON-REACTIVE 07/23/2021   No results found for: HCVAB Lab Results  Component Value Date   CHLAMYDIAWP Negative 07/23/2021   N Negative 07/23/2021   No results found for: GCPROBEAPT No results found for: QUANTGOLD  Health Maintenance: Immunization History  Administered Date(s) Administered   Moderna Sars-Covid-2 Vaccination 10/10/2019, 11/12/2019   Pneumococcal Conjugate-13 07/23/2021   Problem List Items Addressed This Visit       Digestive   Chronic viral hepatitis B without delta agent and without coma (HCC)     Other   HIV disease (HCC) - Primary   Relevant Orders   HIV-1 Genotyping (RTI,PI,IN Inhbtr)   HIV-1 RNA quant-no reflex-bld   Hepatitis A antibody, total   T-helper cells (CD4) count (not at [facility])   Immunization counseling   Medication monitoring encounter    Assessment/Plan: # HIV in a MSM Continue Biktarvy Has stopped taking Bactrim since 2/9, counseled to start taking until we know CD4 consistently above 200 ( 15%) Meds refilled  Labs today including genotype Fu in 6-8  weeks    # Chronic hepatitis B - discussed labs associated with Hepatitis B - on Biktarvy - US abdomen is pending   # h/o Gonorrhea/Chlamydia, treated   # Immunization  - refuse to get vaccines  - Check hep A ab  # Health maintenance - Discussed with dental care. He says he will follow up with his regular dentist  Patient's labs were reviewed as well as his previous records. Patients questions were addressed and answered. Safe sex counseling done.  I have personally spent 40 minutes involved in face-to-face and non-face-to-face activities for this patient on the day of the visit.   Electronically signed by: Odette Fraction, MD Infectious Disease Physician [facility] [address] [place], [place] [address] Phone: [phone]   Fax: [phone]

## 2021-09-04 ENCOUNTER — Other Ambulatory Visit (HOSPITAL_COMMUNITY): Payer: Self-pay

## 2021-09-06 ENCOUNTER — Encounter: Payer: Self-pay | Admitting: Infectious Diseases

## 2021-09-10 ENCOUNTER — Other Ambulatory Visit (HOSPITAL_COMMUNITY): Payer: Self-pay

## 2021-09-22 ENCOUNTER — Other Ambulatory Visit (HOSPITAL_COMMUNITY): Payer: Self-pay

## 2021-09-24 ENCOUNTER — Other Ambulatory Visit (HOSPITAL_COMMUNITY): Payer: Self-pay

## 2021-09-24 LAB — HIV-1 GENOTYPING (RTI,PI,IN INHBTR)
HIV-1 Genotype: NOT DETECTED
Value last viral load: 83000 copies/mL

## 2021-09-24 LAB — HEPATITIS A ANTIBODY, TOTAL: Hepatitis A AB,Total: REACTIVE — AB

## 2021-09-24 LAB — HIV-1 RNA QUANT-NO REFLEX-BLD
HIV 1 RNA Quant: 39 Copies/mL — ABNORMAL HIGH
HIV-1 RNA Quant, Log: 1.59 Log cps/mL — ABNORMAL HIGH

## 2021-09-27 ENCOUNTER — Encounter: Payer: Self-pay | Admitting: Infectious Diseases

## 2021-10-11 ENCOUNTER — Telehealth: Payer: Self-pay

## 2021-10-11 NOTE — Telephone Encounter (Signed)
Called patient regarding lab results and appointment. Patient does not have any questions at this time. Rescheduled appt for June. ?Leatrice Jewels, RMA ? ?

## 2021-10-11 NOTE — Telephone Encounter (Signed)
-----   Message from Odette Fraction, MD sent at 10/11/2021 10:59 AM EDT ----- ?Regarding: HIV FU ?This is the correct patient.  ? ?He has an appt tomorrow - last HIV labs show that he is undetectable. Do not need to see him in person tomorrow. Appt can be switched to a virtual visit or rescheduled in June/July ( whatever patient prefers).  ? ?Thanks   ? ? ?

## 2021-10-12 ENCOUNTER — Other Ambulatory Visit (HOSPITAL_COMMUNITY): Payer: Self-pay

## 2021-10-15 ENCOUNTER — Ambulatory Visit: Payer: Commercial Managed Care - PPO | Admitting: Infectious Diseases

## 2021-10-21 ENCOUNTER — Other Ambulatory Visit (HOSPITAL_COMMUNITY): Payer: Self-pay

## 2021-10-23 ENCOUNTER — Other Ambulatory Visit (HOSPITAL_COMMUNITY): Payer: Self-pay

## 2021-11-05 ENCOUNTER — Encounter: Payer: Self-pay | Admitting: Infectious Diseases

## 2021-11-05 ENCOUNTER — Ambulatory Visit: Payer: Commercial Managed Care - PPO | Admitting: Internal Medicine

## 2021-11-10 ENCOUNTER — Other Ambulatory Visit (HOSPITAL_COMMUNITY): Payer: Self-pay

## 2021-11-12 ENCOUNTER — Ambulatory Visit: Payer: Commercial Managed Care - PPO | Admitting: Infectious Diseases

## 2021-11-22 ENCOUNTER — Other Ambulatory Visit (HOSPITAL_COMMUNITY): Payer: Self-pay

## 2021-12-14 ENCOUNTER — Other Ambulatory Visit (HOSPITAL_COMMUNITY): Payer: Self-pay

## 2021-12-16 ENCOUNTER — Encounter: Payer: Self-pay | Admitting: Infectious Diseases

## 2021-12-16 ENCOUNTER — Other Ambulatory Visit (HOSPITAL_COMMUNITY)
Admission: RE | Admit: 2021-12-16 | Discharge: 2021-12-16 | Disposition: A | Discharge status: Home / Self Care | Payer: Commercial Managed Care - PPO | Source: Ambulatory Visit | Attending: Infectious Diseases | Admitting: Infectious Diseases

## 2021-12-16 ENCOUNTER — Ambulatory Visit: Payer: Commercial Managed Care - PPO | Admitting: Infectious Diseases

## 2021-12-16 ENCOUNTER — Other Ambulatory Visit: Payer: Self-pay

## 2021-12-16 ENCOUNTER — Other Ambulatory Visit (HOSPITAL_COMMUNITY): Payer: Self-pay

## 2021-12-16 VITALS — BP 138/87 | HR 102 | Temp 99.0°F | Wt 182.2 lb

## 2021-12-16 DIAGNOSIS — Z7185 Encounter for immunization safety counseling: Secondary | ICD-10-CM

## 2021-12-16 DIAGNOSIS — Z113 Encounter for screening for infections with a predominantly sexual mode of transmission: Secondary | ICD-10-CM

## 2021-12-16 DIAGNOSIS — Z5181 Encounter for therapeutic drug level monitoring: Secondary | ICD-10-CM | POA: Diagnosis not present

## 2021-12-16 DIAGNOSIS — B20 Human immunodeficiency virus [HIV] disease: Secondary | ICD-10-CM

## 2021-12-16 LAB — CYTOLOGY, (ORAL, ANAL, URETHRAL) ANCILLARY ONLY
Chlamydia: NEGATIVE
Chlamydia: NEGATIVE
Comment: NEGATIVE
Comment: NEGATIVE
Comment: NORMAL
Comment: NORMAL
Neisseria Gonorrhea: NEGATIVE
Neisseria Gonorrhea: NEGATIVE

## 2021-12-16 LAB — URINE CYTOLOGY ANCILLARY ONLY
Chlamydia: NEGATIVE
Comment: NEGATIVE
Comment: NORMAL
Neisseria Gonorrhea: NEGATIVE

## 2021-12-16 MED ORDER — BICTEGRAVIR-EMTRICITAB-TENOFOV 50-200-25 MG PO TABS
1.0000 | ORAL_TABLET | Freq: Every day | ORAL | 5 refills | Status: DC
Start: 2021-12-16 — End: 2022-05-19
  Filled 2021-12-16 – 2022-01-12 (×2): qty 30, 30d supply, fill #0
  Filled 2022-02-08: qty 30, 30d supply, fill #1
  Filled 2022-03-04: qty 30, 30d supply, fill #2
  Filled 2022-04-01: qty 30, 30d supply, fill #3
  Filled 2022-04-26: qty 30, 30d supply, fill #4
  Filled ????-??-??: fill #0

## 2021-12-16 NOTE — Progress Notes (Signed)
[address], [place], [place], [address]                                                                  Phn. [phone]; Fax: [phone]                                                                             Date: 12/16/21  Reason for Visit: Routine HIV care.  HPI: Eric Underwood is a 31 y.o.old male with a history of HIV.  Last seen on 09/03/21. Currently on Biktarvy   Interval hx/current visit: Taking biktarvy, no missed doses. Was off bactrim for 2 weeks, started taking back from yesterday. Sexually active with his male partner, no GU concerns, Willing to get screened for STDS. Tells he has received 2 boosters for COVID, [facility]. He will sent a pic of covid card throughmy chart. Denies smoking, alcohol and IVDU. Denies feelings of sadness, hopelessness. Declines Monkey pox vaccine. US abdomen was not done as his copay was 1000$. No complaints.  ROS: As stated in above HPI; all other systems were reviewed and are otherwise negative unless noted below  No reported fever / chills, night sweats, unintentional weight loss, acute visual change, odynophagia, chest pain/pressure, new or worsened SOB or WOB, nausea, vomiting, diarrhea, dysuria, GU discharge, syncope, seizures, red/hot swollen joints, hallucinations / delusions, rashes, new allergies, unusual / excessive bleeding, swollen lymph nodes, or new hospitalizations/ED visits/Urgent Care visits since the pt was last seen.  PMH/ PSH/ FamHx / Social Hx , medications and allergies reviewed and updated as appropriate; please see corresponding tab in EHR / prior notes                                        Current Outpatient Medications on File Prior to Visit  Medication Sig Dispense Refill    bictegravir-emtricitabine-tenofovir AF (BIKTARVY) 50-200-25 MG TABS tablet Take 1 tablet by mouth daily. 30 tablet 5   sulfamethoxazole-trimethoprim (BACTRIM) 400-80 MG tablet Take 1 tablet by mouth daily. (Patient not taking: Reported on 12/16/2021) 30 tablet 2   No current facility-administered medications on file prior to visit.     Allergies  Allergen Reactions   Penicillins     Has patient had a PCN reaction causing immediate rash, facial/tongue/throat swelling, SOB or lightheadedness with hypotension: Unknown Has patient had a PCN reaction causing severe rash involving mucus membranes or skin necrosis: Unknown Has patient had a PCN reaction that required hospitalization: Unknown Has patient had a PCN  reaction occurring within the last 10 years: Unknown If all of the above answers are "NO", then may proceed with Cephalosporin use.    Social History   Socioeconomic History   Marital status: Single    Spouse name: Not on file   Number of children: Not on file   Years of education: Not on file   Highest education level: Not on file  Occupational History   Not on file  Tobacco Use   Smoking status: Never   Smokeless tobacco: Never  Vaping Use   Vaping Use: Never used  Substance and Sexual Activity   Alcohol use: No   Drug use: No   Sexual activity: Not on file    Comment: declined condoms  Other Topics Concern   Not on file  Social History Narrative   Not on file   Social Determinants of Health   Financial Resource Strain: Not on file  Food Insecurity: Not on file  Transportation Needs: Not on file  Physical Activity: Not on file  Stress: Not on file  Social Connections: Not on file  Intimate Partner Violence: Not on file   No family history on file.   Vitals  BP 138/87   Pulse (!) 102   Temp 99 F (37.2 C) (Oral)   Wt 182 lb 3.2 oz (82.6 kg)   SpO2 99%   BMI 24.04 kg/m    Examination  Gen: Alert and oriented x 3, no acute distress HEENT: [place]/AT, no  scleral icterus, no pale conjunctivae, hearing normal, oral mucosa moist Neck: Supple Cardio: Regular rate and rhythm; +S1 and S2 Resp: CTAB GI: nondistended GU: Musc: Extremities: No cyanosis, clubbing, or edema Skin: No rashes, lesions, or ecchymoses Neuro: grossly non focal  Psych: Calm, cooperative    Lab Results HIV 1 RNA Quant  Date Value  09/03/2021 39 Copies/mL (H)  07/23/2021 83,000 copies/mL (H)   No results found for: HIV1GENOSEQ Lab Results  Component Value Date   WBC 4.9 07/23/2021   HGB 16.3 07/23/2021   HCT 47.9 07/23/2021   MCV 92.5 07/23/2021   PLT 267 07/23/2021    Lab Results  Component Value Date   CREATININE 0.91 07/23/2021   BUN 7 07/23/2021   NA 139 07/23/2021   K 4.1 07/23/2021   CL 103 07/23/2021   CO2 27 07/23/2021   Lab Results  Component Value Date   ALT 19 07/23/2021   AST 31 07/23/2021   ALKPHOS 52 06/27/2017   BILITOT 0.4 07/23/2021    No results found for: CHOL, TRIG, HDL, LDLCALC Lab Results  Component Value Date   HAV REACTIVE (A) 09/03/2021   Lab Results  Component Value Date   HEPBSAG REACTIVE (A) 07/23/2021   HEPBSAB NON-REACTIVE 07/23/2021   No results found for: HCVAB Lab Results  Component Value Date   CHLAMYDIAWP Negative 07/23/2021   N Negative 07/23/2021   No results found for: GCPROBEAPT No results found for: QUANTGOLD  Health Maintenance: Immunization History  Administered Date(s) Administered   Moderna Sars-Covid-2 Vaccination 10/10/2019, 11/12/2019   Pneumococcal Conjugate-13 07/23/2021   Problem List Items Addressed This Visit       Other   HIV disease (HCC) - Primary   Relevant Orders   T-helper cell (CD4)- ([facility] clinic only)   HIV-1 RNA quant-no reflex-bld   Screening for STDs (sexually transmitted diseases)   Relevant Orders   Urine cytology ancillary only   Cytology (oral, anal, urethral) ancillary only   Cytology (oral, anal, urethral) ancillary  only   RPR   Immunization counseling    Medication monitoring encounter   Assessment/Plan: # HIV in a MSM Continue Biktarvy and Bactrim  Encouraged on compliance Will stop bactrim if CD4 more than 200( 15%) Labs today Fu in 5 months   # Chronic hepatitis B on Biktarvy US abdomen not done due to high copay  # STD screening Urine/oral and anal GC RPR  # Immunization Counseling Denies Monkey pox vaccine He will sent picture of covid card. Tells me he has received 2 doses of boosters  # HM Fu with dentist with [facility] Will discuss about anal pap in next visit  Patient's labs were reviewed as well as his previous records. Patients questions were addressed and answered. Safe sex counseling done.  I have personally spent 40 minutes involved in face-to-face and non-face-to-face activities for this patient on the day of the visit.   Electronically signed by: Odette Fraction, MD Infectious Disease Physician [facility] [address] [place], [place] [address] Phone: [phone]  Fax: [phone]

## 2021-12-17 ENCOUNTER — Other Ambulatory Visit (HOSPITAL_COMMUNITY): Payer: Self-pay

## 2021-12-17 LAB — T-HELPER CELL (CD4) - (RCID CLINIC ONLY)
CD4 % Helper T Cell: 17 % — ABNORMAL LOW (ref 33–65)
CD4 T Cell Abs: 247 /uL — ABNORMAL LOW (ref 400–1790)

## 2021-12-20 ENCOUNTER — Other Ambulatory Visit (HOSPITAL_COMMUNITY): Payer: Self-pay

## 2021-12-20 LAB — HIV-1 RNA QUANT-NO REFLEX-BLD
HIV 1 RNA Quant: 22 Copies/mL — ABNORMAL HIGH
HIV-1 RNA Quant, Log: 1.34 Log cps/mL — ABNORMAL HIGH

## 2021-12-20 LAB — RPR: RPR Ser Ql: NONREACTIVE

## 2021-12-21 ENCOUNTER — Telehealth: Payer: Self-pay

## 2021-12-21 NOTE — Telephone Encounter (Signed)
-----   Message from Odette Fraction, MD sent at 12/21/2021 10:18 AM EDT ----- Let him know VL is down from 39 to 22. CD4 has been consistently more than 200. He can stop taking Bactrim.

## 2021-12-21 NOTE — Telephone Encounter (Signed)
Called patient to relay provider's message regarding labs. Patient does not have any question about results at this time. Will follow up as planned.  Juanita Laster, RMA

## 2021-12-30 ENCOUNTER — Other Ambulatory Visit (HOSPITAL_COMMUNITY): Payer: Self-pay

## 2022-01-12 ENCOUNTER — Other Ambulatory Visit (HOSPITAL_COMMUNITY): Payer: Self-pay

## 2022-01-19 ENCOUNTER — Other Ambulatory Visit (HOSPITAL_COMMUNITY): Payer: Self-pay

## 2022-02-08 ENCOUNTER — Other Ambulatory Visit (HOSPITAL_COMMUNITY): Payer: Self-pay

## 2022-02-09 ENCOUNTER — Other Ambulatory Visit (HOSPITAL_COMMUNITY): Payer: Self-pay

## 2022-02-11 ENCOUNTER — Ambulatory Visit (INDEPENDENT_AMBULATORY_CARE_PROVIDER_SITE_OTHER): Payer: Commercial Managed Care - PPO | Admitting: Pharmacist

## 2022-02-11 ENCOUNTER — Other Ambulatory Visit: Payer: Self-pay

## 2022-02-11 ENCOUNTER — Other Ambulatory Visit (HOSPITAL_COMMUNITY)
Admission: RE | Admit: 2022-02-11 | Discharge: 2022-02-11 | Disposition: A | Discharge status: Home / Self Care | Payer: Commercial Managed Care - PPO | Source: Ambulatory Visit | Attending: Infectious Diseases | Admitting: Infectious Diseases

## 2022-02-11 DIAGNOSIS — Z113 Encounter for screening for infections with a predominantly sexual mode of transmission: Secondary | ICD-10-CM | POA: Insufficient documentation

## 2022-02-11 NOTE — Progress Notes (Signed)
   [facility] STI Visit  HPI: Eric Underwood is a 31 y.o. male who presents to the [facility] clinic for STI testing.  Hepatitis B Lab Results  Component Value Date   HEPBSAB NON-REACTIVE 07/23/2021   Lab Results  Component Value Date   HEPBSAG REACTIVE (A) 07/23/2021    Hepatitis C No results found for: "HCVAB"  Hepatitis A Lab Results  Component Value Date   HAV REACTIVE (A) 09/03/2021    Assessment: Eric Underwood presents to clinic today for STI testing. He is exclusive with one male partner and is both the insertive and receptive partner. He does not use condoms and is unaware of his partner being active with any other partners. His partner began experiencing urinary discharge and burning upon urination in the past 2-3 days, and they were last sexually active 1 week ago. Patient requesting full STI screening with RPR and urine/rectal testing. He denies any recent oral sex so will defer this testing today. Will follow-up to see if treatment is needed.  Plan: - Follow-up on RPR and urine/rectal cytologies  Margarite Gouge, PharmD, CPP, BCIDP Clinical Pharmacist Practitioner Infectious Diseases Clinical Pharmacist [facility] 02/11/2022, 12:22 PM

## 2022-02-12 LAB — RPR: RPR Ser Ql: NONREACTIVE

## 2022-02-14 LAB — URINE CYTOLOGY ANCILLARY ONLY
Chlamydia: NEGATIVE
Comment: NEGATIVE
Comment: NORMAL
Neisseria Gonorrhea: NEGATIVE

## 2022-02-14 LAB — CYTOLOGY, (ORAL, ANAL, URETHRAL) ANCILLARY ONLY
Chlamydia: NEGATIVE
Comment: NEGATIVE
Comment: NORMAL
Neisseria Gonorrhea: POSITIVE — AB

## 2022-02-15 ENCOUNTER — Encounter: Payer: Self-pay | Admitting: Infectious Diseases

## 2022-02-15 ENCOUNTER — Other Ambulatory Visit: Payer: Self-pay

## 2022-02-15 ENCOUNTER — Ambulatory Visit (INDEPENDENT_AMBULATORY_CARE_PROVIDER_SITE_OTHER): Payer: Commercial Managed Care - PPO | Admitting: Pharmacist

## 2022-02-15 ENCOUNTER — Other Ambulatory Visit (HOSPITAL_COMMUNITY): Payer: Self-pay

## 2022-02-15 DIAGNOSIS — A549 Gonococcal infection, unspecified: Secondary | ICD-10-CM

## 2022-02-15 MED ORDER — CEFTRIAXONE SODIUM 500 MG IJ SOLR
500.0000 mg | Freq: Once | INTRAMUSCULAR | Status: AC
  Administered 2022-02-15: 500 mg via INTRAMUSCULAR

## 2022-02-15 NOTE — Progress Notes (Cosign Needed Addendum)
   [facility] STI Visit  HPI: Eric Underwood is a 31 y.o. male who presents to the [facility] clinic for STI follow-up and treatment.  Hepatitis B Lab Results  Component Value Date   HEPBSAB NON-REACTIVE 07/23/2021   Lab Results  Component Value Date   HEPBSAG REACTIVE (A) 07/23/2021    Hepatitis C No results found for: "HCVAB"  Hepatitis A Lab Results  Component Value Date   HAV REACTIVE (A) 09/03/2021    Assessment & Plan: - Administered ceftriaxone 500mg  x 1 for rectal gonorrhea  - Patient tolerated well (historical reaction to penicillins, but cross-reactivity with ceftriaxone is very low. Patient has received ceftriaxone for gonorrhea before) - Patient aware to refrain from sexual activity x 7 days and to inform partners of test results  , PharmD, CPP, BCIDP Clinical Pharmacist Practitioner Infectious Diseases Clinical Pharmacist [facility] 02/15/2022, 10:44 AM

## 2022-03-04 ENCOUNTER — Other Ambulatory Visit (HOSPITAL_COMMUNITY): Payer: Self-pay

## 2022-03-10 ENCOUNTER — Other Ambulatory Visit (HOSPITAL_COMMUNITY): Payer: Self-pay

## 2022-04-01 ENCOUNTER — Other Ambulatory Visit (HOSPITAL_COMMUNITY): Payer: Self-pay

## 2022-04-07 ENCOUNTER — Other Ambulatory Visit (HOSPITAL_COMMUNITY): Payer: Self-pay

## 2022-04-26 ENCOUNTER — Other Ambulatory Visit (HOSPITAL_COMMUNITY): Payer: Self-pay

## 2022-05-02 ENCOUNTER — Other Ambulatory Visit (HOSPITAL_COMMUNITY): Payer: Self-pay

## 2022-05-19 ENCOUNTER — Other Ambulatory Visit (HOSPITAL_COMMUNITY): Payer: Self-pay

## 2022-05-19 ENCOUNTER — Encounter: Payer: Self-pay | Admitting: Infectious Diseases

## 2022-05-19 ENCOUNTER — Ambulatory Visit (INDEPENDENT_AMBULATORY_CARE_PROVIDER_SITE_OTHER): Payer: Commercial Managed Care - PPO | Admitting: Infectious Diseases

## 2022-05-19 ENCOUNTER — Other Ambulatory Visit: Payer: Self-pay

## 2022-05-19 VITALS — BP 133/83 | HR 81 | Resp 16 | Ht 73.0 in | Wt 202.0 lb

## 2022-05-19 DIAGNOSIS — Z113 Encounter for screening for infections with a predominantly sexual mode of transmission: Secondary | ICD-10-CM | POA: Diagnosis not present

## 2022-05-19 DIAGNOSIS — B20 Human immunodeficiency virus [HIV] disease: Secondary | ICD-10-CM | POA: Diagnosis not present

## 2022-05-19 DIAGNOSIS — Z7185 Encounter for immunization safety counseling: Secondary | ICD-10-CM | POA: Diagnosis not present

## 2022-05-19 DIAGNOSIS — Z5181 Encounter for therapeutic drug level monitoring: Secondary | ICD-10-CM

## 2022-05-19 MED ORDER — BICTEGRAVIR-EMTRICITAB-TENOFOV 50-200-25 MG PO TABS
1.0000 | ORAL_TABLET | Freq: Every day | ORAL | 11 refills | Status: DC
  Filled 2022-05-19 – 2022-05-24 (×2): qty 30, 30d supply, fill #0
  Filled 2022-06-20: qty 30, 30d supply, fill #1
  Filled 2022-08-08: qty 30, 30d supply, fill #2
  Filled 2022-09-05 – 2022-09-07 (×2): qty 30, 30d supply, fill #3
  Filled 2022-10-03 – 2022-10-10 (×4): qty 30, 30d supply, fill #4
  Filled 2022-11-01: qty 30, 30d supply, fill #5
  Filled 2022-11-24 (×2): qty 30, 30d supply, fill #6
  Filled 2023-01-04: qty 30, 30d supply, fill #7
  Filled 2023-02-01: qty 30, 30d supply, fill #8
  Filled 2023-03-07: qty 30, 30d supply, fill #9
  Filled 2023-04-03: qty 30, 30d supply, fill #10
  Filled 2023-05-02: qty 30, 30d supply, fill #11

## 2022-05-19 NOTE — Addendum Note (Signed)
Addended by: Rosiland Oz on: 05/19/2022 09:04 AM   Modules accepted: Orders

## 2022-05-19 NOTE — Addendum Note (Signed)
Addended by: Pamela Intrieri P on: 05/19/2022 03:14 PM   Modules accepted: Orders  

## 2022-05-19 NOTE — Progress Notes (Signed)
[place], [place], [place], [address]                                                                  Phn. [phone]; Fax: [phone]                                                                             Date: 05/19/22  Reason for Visit: Routine HIV care.  HPI: Eric Underwood is a 31 y.o.old male with a history of HIV.  Last seen on 12/16/21. Currently on Biktarvy. Seen by Pharm D 8/1 and received 1 dose of ceftriaxone for rectal gonorrhea   Interval hx/current visit: Taking biktarvy, no missed doses. Denies any barriers to adherence of tx. He has stopped taking Bactrim which is OK as he is undetectable and Cd4 more than 200.  Sexually active with same male partner, does receptive, insertive as well as oral sex. Declined STD screening. Denies smoking, alcohol and IVDU. He works as a Administrator. He has not seen dentist yet as he is trying to find a new dentist after he moved from his old job. He declined Flu vaccine. Declines any rectal symptoms. Doing well otherwise with no complaints.   ROS: As stated in above HPI; all other systems were reviewed and are otherwise negative unless noted below  No reported fever / chills, night sweats, unintentional weight loss, acute visual change, odynophagia, chest pain/pressure, new or worsened SOB or WOB, nausea, vomiting, diarrhea, dysuria, GU discharge, syncope, seizures, red/hot swollen joints, hallucinations / delusions, rashes, new allergies, unusual / excessive bleeding, swollen lymph nodes, or new hospitalizations/ED visits/Urgent Care visits since the pt was last seen.  PMH/ PSH/ FamHx / Social Hx , medications and allergies reviewed and updated as appropriate; please see corresponding tab in EHR / prior notes  No past medical history  on file.  No past surgical history on file.                                         Current Outpatient Medications on File Prior to Visit  Medication Sig Dispense Refill   bictegravir-emtricitabine-tenofovir AF (BIKTARVY) 50-200-25 MG TABS tablet Take 1 tablet by mouth daily. 30 tablet 5   No current facility-administered medications on file prior to visit.    Allergies  Allergen Reactions   Penicillins     Has patient had a PCN reaction causing immediate rash, facial/tongue/throat swelling, SOB or lightheadedness with hypotension: Unknown Has patient had a PCN reaction causing severe rash involving mucus  membranes or skin necrosis: Unknown Has patient had a PCN reaction that required hospitalization: Unknown Has patient had a PCN reaction occurring within the last 10 years: Unknown If all of the above answers are "NO", then may proceed with Cephalosporin use.    Social History   Socioeconomic History   Marital status: Single    Spouse name: Not on file   Number of children: Not on file   Years of education: Not on file   Highest education level: Not on file  Occupational History   Not on file  Tobacco Use   Smoking status: Never   Smokeless tobacco: Never  Vaping Use   Vaping Use: Never used  Substance and Sexual Activity   Alcohol use: No   Drug use: No   Sexual activity: Not on file    Comment: declined condoms  Other Topics Concern   Not on file  Social History Narrative   Not on file   Social Determinants of Health   Financial Resource Strain: Not on file  Food Insecurity: Not on file  Transportation Needs: Not on file  Physical Activity: Not on file  Stress: Not on file  Social Connections: Not on file  Intimate Partner Violence: Not on file   No family history on file.   Vitals  Resp 16   Ht 6\' 1"  (1.854 m)   Wt 202 lb (91.6 kg)   BMI 26.65 kg/m    Examination  Gen: Alert and oriented x 3, no acute distress HEENT: [place]/AT, no scleral icterus,  no pale conjunctivae, hearing normal, oral mucosa moist Neck: Supple Cardio: Regular rate and rhythm Resp: CTAB GI: nondistended GU: Musc: Extremities: No pedal edema Skin: No rashes Neuro: grossly non focal  Psych: Calm, cooperative    Lab Results HIV 1 RNA Quant  Date Value  12/16/2021 22 Copies/mL (H)  09/03/2021 39 Copies/mL (H)  07/23/2021 83,000 copies/mL (H)   CD4 T Cell Abs (/uL)  Date Value  12/16/2021 247 (L)   No results found for: "HIV1GENOSEQ" Lab Results  Component Value Date   WBC 4.9 07/23/2021   HGB 16.3 07/23/2021   HCT 47.9 07/23/2021   MCV 92.5 07/23/2021   PLT 267 07/23/2021    Lab Results  Component Value Date   CREATININE 0.91 07/23/2021   BUN 7 07/23/2021   NA 139 07/23/2021   K 4.1 07/23/2021   CL 103 07/23/2021   CO2 27 07/23/2021   Lab Results  Component Value Date   ALT 19 07/23/2021   AST 31 07/23/2021   ALKPHOS 52 06/27/2017   BILITOT 0.4 07/23/2021    No results found for: "CHOL", "TRIG", "HDL", "LDLCALC" Lab Results  Component Value Date   HAV REACTIVE (A) 09/03/2021   Lab Results  Component Value Date   HEPBSAG REACTIVE (A) 07/23/2021   HEPBSAB NON-REACTIVE 07/23/2021   No results found for: "HCVAB" Lab Results  Component Value Date   CHLAMYDIAWP Negative 02/11/2022   CHLAMYDIAWP Negative 02/11/2022   N Positive (A) 02/11/2022   N Negative 02/11/2022   No results found for: "GCPROBEAPT" No results found for: "QUANTGOLD"  Health Maintenance: Immunization History  Administered Date(s) Administered   Moderna Covid-19 Vaccine Bivalent Booster 52yrs & up 09/03/2021   Moderna SARS-COV2 Booster Vaccination 05/11/2020   Moderna Sars-Covid-2 Vaccination 10/10/2019, 11/12/2019   Pneumococcal Conjugate-13 07/23/2021    Assessment/Plan: # HIV in a MSM # Mediation Monitoring Continue 09/20/2021 today Offered condms Fu in 4 months   #  Chronic hepatitis B on Biktarvy CMP as above US abdomen not done due to  high copay  # STD screening - recently treated for rectal gonorrhea with IM ceftriaxone, no symptoms today. Will not do test of cure  # Immunization Counseling - Declined Flu vaccine   # HM Fu with dentist discussed   Patient's labs were reviewed as well as his previous records. Patients questions were addressed and answered. Safe sex counseling done.  I have personally spent 40 minutes involved in face-to-face and non-face-to-face activities for this patient on the day of the visit.   Electronically signed by: Odette Fraction, MD Infectious Disease Physician [facility] [address] [place], [place] [address] Phone: [phone]  Fax: [phone]

## 2022-05-20 ENCOUNTER — Encounter: Payer: Self-pay | Admitting: Infectious Diseases

## 2022-05-20 ENCOUNTER — Other Ambulatory Visit (HOSPITAL_COMMUNITY): Payer: Self-pay

## 2022-05-20 LAB — T-HELPER CELLS (CD4) COUNT (NOT AT ARMC)
CD4 % Helper T Cell: 26 % — ABNORMAL LOW (ref 33–65)
CD4 T Cell Abs: 319 /uL — ABNORMAL LOW (ref 400–1790)

## 2022-05-23 LAB — COMPREHENSIVE METABOLIC PANEL
AG Ratio: 1.4 (calc) (ref 1.0–2.5)
ALT: 15 U/L (ref 9–46)
AST: 22 U/L (ref 10–40)
Albumin: 4.6 g/dL (ref 3.6–5.1)
Alkaline phosphatase (APISO): 54 U/L (ref 36–130)
BUN: 11 mg/dL (ref 7–25)
CO2: 29 mmol/L (ref 20–32)
Calcium: 9.7 mg/dL (ref 8.6–10.3)
Chloride: 102 mmol/L (ref 98–110)
Creat: 1.14 mg/dL (ref 0.60–1.26)
Globulin: 3.4 g/dL (calc) (ref 1.9–3.7)
Glucose, Bld: 73 mg/dL (ref 65–99)
Potassium: 4.1 mmol/L (ref 3.5–5.3)
Sodium: 138 mmol/L (ref 135–146)
Total Bilirubin: 0.6 mg/dL (ref 0.2–1.2)
Total Protein: 8 g/dL (ref 6.1–8.1)

## 2022-05-23 LAB — HIV RNA, RTPCR W/R GT (RTI, PI,INT)
HIV 1 RNA Quant: NOT DETECTED copies/mL
HIV-1 RNA Quant, Log: NOT DETECTED Log copies/mL

## 2022-05-24 ENCOUNTER — Other Ambulatory Visit (HOSPITAL_COMMUNITY): Payer: Self-pay

## 2022-05-30 ENCOUNTER — Other Ambulatory Visit (HOSPITAL_COMMUNITY): Payer: Self-pay

## 2022-06-01 ENCOUNTER — Other Ambulatory Visit (HOSPITAL_COMMUNITY): Payer: Self-pay

## 2022-06-20 ENCOUNTER — Other Ambulatory Visit (HOSPITAL_COMMUNITY): Payer: Self-pay

## 2022-06-24 ENCOUNTER — Other Ambulatory Visit (HOSPITAL_COMMUNITY): Payer: Self-pay

## 2022-06-27 ENCOUNTER — Other Ambulatory Visit (HOSPITAL_COMMUNITY): Payer: Self-pay

## 2022-07-20 ENCOUNTER — Other Ambulatory Visit (HOSPITAL_COMMUNITY): Payer: Self-pay

## 2022-08-05 ENCOUNTER — Other Ambulatory Visit (HOSPITAL_COMMUNITY): Payer: Self-pay

## 2022-08-08 ENCOUNTER — Other Ambulatory Visit (HOSPITAL_COMMUNITY): Payer: Self-pay

## 2022-09-05 ENCOUNTER — Other Ambulatory Visit: Payer: Self-pay

## 2022-09-07 ENCOUNTER — Other Ambulatory Visit (HOSPITAL_COMMUNITY): Payer: Self-pay

## 2022-09-08 ENCOUNTER — Other Ambulatory Visit (HOSPITAL_COMMUNITY): Payer: Self-pay

## 2022-09-16 ENCOUNTER — Ambulatory Visit (INDEPENDENT_AMBULATORY_CARE_PROVIDER_SITE_OTHER): Payer: Commercial Managed Care - PPO | Admitting: Infectious Diseases

## 2022-09-16 ENCOUNTER — Other Ambulatory Visit: Payer: Self-pay

## 2022-09-16 ENCOUNTER — Encounter: Payer: Self-pay | Admitting: Infectious Diseases

## 2022-09-16 VITALS — BP 129/80 | HR 76 | Temp 97.6°F | Ht 73.0 in | Wt 193.0 lb

## 2022-09-16 DIAGNOSIS — Z7185 Encounter for immunization safety counseling: Secondary | ICD-10-CM | POA: Diagnosis not present

## 2022-09-16 DIAGNOSIS — Z5181 Encounter for therapeutic drug level monitoring: Secondary | ICD-10-CM

## 2022-09-16 DIAGNOSIS — B20 Human immunodeficiency virus [HIV] disease: Secondary | ICD-10-CM | POA: Diagnosis not present

## 2022-09-16 DIAGNOSIS — Z113 Encounter for screening for infections with a predominantly sexual mode of transmission: Secondary | ICD-10-CM

## 2022-09-16 DIAGNOSIS — Z0000 Encounter for general adult medical examination without abnormal findings: Secondary | ICD-10-CM | POA: Insufficient documentation

## 2022-09-16 DIAGNOSIS — B181 Chronic viral hepatitis B without delta-agent: Secondary | ICD-10-CM

## 2022-09-16 NOTE — Addendum Note (Signed)
Addended by: Caffie Pinto on: 09/16/2022 09:01 AM   Modules accepted: Orders

## 2022-09-16 NOTE — Progress Notes (Signed)
[place], [place], [place], [address]                                                                  Phn. [phone]; Fax: [phone]                                                                             Date: 09/16/22  Reason for Visit: Routine HIV care.  HPI: Eric Underwood is a 32 y.o.old male with a history of HIV.  Last seen on 05/19/22. On Biktarvy.  Lab Results  Component Value Date   HIV1RNAQUANT NOT DETECTED 05/19/2022   Lab Results  Component Value Date   CD4TABS 319 (L) 05/19/2022   CD4TABS 247 (L) 12/16/2021    Interval hx/current visit: Reports compliance with Biktarvy. Denies missing doses or barriers to adherence of tx. He is with the same male partner. Denies recent sexual activity and declined condoms as well as STD testing today. Denies smoking, alcohol and IVDU. He is seeing a Pharmacist, community outside. He is doing overall well and has no complaints today.   ROS: As stated in above HPI; all other systems were reviewed and are otherwise negative unless noted below  No reported fever / chills, night sweats, unintentional weight loss, acute visual change, odynophagia, chest pain/pressure, new or worsened SOB or WOB, nausea, vomiting, diarrhea, dysuria, GU discharge, syncope, seizures, red/hot swollen joints, hallucinations / delusions, rashes, new allergies, unusual / excessive bleeding, swollen lymph nodes, or new hospitalizations/ED visits/Urgent Care visits since the pt was last seen.  PMH/ PSH/ FamHx / Social Hx , medications and allergies reviewed and updated as appropriate; please see corresponding tab in EHR / prior notes  No past medical history on file.  No past surgical history on file.                                         Current Outpatient  Medications on File Prior to Visit  Medication Sig Dispense Refill   bictegravir-emtricitabine-tenofovir AF (BIKTARVY) 50-200-25 MG TABS tablet Take 1 tablet by mouth daily. 30 tablet 11   No current facility-administered medications on file prior to visit.    Allergies  Allergen Reactions   Penicillins     Has patient had a PCN reaction causing immediate rash, facial/tongue/throat swelling, SOB or lightheadedness with hypotension: Unknown Has patient had a PCN reaction causing severe rash involving mucus membranes or skin necrosis: Unknown Has patient had a PCN reaction that required hospitalization: Unknown Has patient had a PCN reaction  occurring within the last 10 years: Unknown If all of the above answers are "NO", then may proceed with Cephalosporin use.    Social History   Socioeconomic History   Marital status: Single    Spouse name: Not on file   Number of children: Not on file   Years of education: Not on file   Highest education level: Not on file  Occupational History   Not on file  Tobacco Use   Smoking status: Never   Smokeless tobacco: Never  Vaping Use   Vaping Use: Never used  Substance and Sexual Activity   Alcohol use: No   Drug use: No   Sexual activity: Not on file    Comment: declined condoms  Other Topics Concern   Not on file  Social History Narrative   Not on file   Social Determinants of Health   Financial Resource Strain: Not on file  Food Insecurity: Not on file  Transportation Needs: Not on file  Physical Activity: Not on file  Stress: Not on file  Social Connections: Not on file  Intimate Partner Violence: Not on file   No family history on file.   Vitals  BP 129/80   Pulse 76   Temp 97.6 F (36.4 C) (Temporal)   Ht '6\' 1"'$  (1.854 m)   Wt 193 lb (87.5 kg)   SpO2 99%   BMI 25.46 kg/m   Examination  Gen: Alert and oriented x 3, no acute distress HEENT: [place]/AT, no scleral icterus, no pale conjunctivae, hearing normal, oral  mucosa moist Neck: Supple Cardio: Regular rate and rhythm Resp: CTAB GI: nondistended GU: Musc: Extremities: No pedal edema Skin: No rashes Neuro: grossly non focal  Psych: Calm, cooperative    Lab Results HIV 1 RNA Quant  Date Value  05/19/2022 NOT DETECTED copies/mL  12/16/2021 22 Copies/mL (H)  09/03/2021 39 Copies/mL (H)   CD4 T Cell Abs (/uL)  Date Value  05/19/2022 319 (L)  12/16/2021 247 (L)   No results found for: "HIV1GENOSEQ" Lab Results  Component Value Date   WBC 4.9 07/23/2021   HGB 16.3 07/23/2021   HCT 47.9 07/23/2021   MCV 92.5 07/23/2021   PLT 267 07/23/2021    Lab Results  Component Value Date   CREATININE 1.14 05/19/2022   BUN 11 05/19/2022   NA 138 05/19/2022   K 4.1 05/19/2022   CL 102 05/19/2022   CO2 29 05/19/2022   Lab Results  Component Value Date   ALT 15 05/19/2022   AST 22 05/19/2022   ALKPHOS 52 06/27/2017   BILITOT 0.6 05/19/2022    No results found for: "CHOL", "TRIG", "HDL", "LDLCALC" Lab Results  Component Value Date   HAV REACTIVE (A) 09/03/2021   Lab Results  Component Value Date   HEPBSAG REACTIVE (A) 07/23/2021   HEPBSAB NON-REACTIVE 07/23/2021   No results found for: "HCVAB" Lab Results  Component Value Date   CHLAMYDIAWP Negative 02/11/2022   CHLAMYDIAWP Negative 02/11/2022   N Positive (A) 02/11/2022   N Negative 02/11/2022   No results found for: "GCPROBEAPT" No results found for: "QUANTGOLD"  Health Maintenance: Immunization History  Administered Date(s) Administered   Moderna Covid-19 Vaccine Bivalent Booster 46yr & up 09/03/2021   Moderna SARS-COV2 Booster Vaccination 05/11/2020   Moderna Sars-Covid-2 Vaccination 10/10/2019, 11/12/2019   Pneumococcal Conjugate-13 07/23/2021    Assessment/Plan: # 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 Patients questions were addressed and answered. Safe sex counseling done.  I have personally spent 40 minutes involved in face-to-face and non-face-to-face activities for this patient on the day of the visit.   Electronically signed by: Rosiland Oz, MD Infectious Disease Physician [facility] [address] [place], [place] [address] Phone: [phone]  Fax: [phone]

## 2022-09-16 NOTE — Addendum Note (Signed)
Addended by: Caffie Pinto on: 09/16/2022 08:41 AM   Modules accepted: Orders

## 2022-09-20 LAB — COMPREHENSIVE METABOLIC PANEL
AG Ratio: 1.3 (calc) (ref 1.0–2.5)
ALT: 13 U/L (ref 9–46)
AST: 20 U/L (ref 10–40)
Albumin: 4.3 g/dL (ref 3.6–5.1)
Alkaline phosphatase (APISO): 55 U/L (ref 36–130)
BUN: 13 mg/dL (ref 7–25)
CO2: 26 mmol/L (ref 20–32)
Calcium: 9.3 mg/dL (ref 8.6–10.3)
Chloride: 103 mmol/L (ref 98–110)
Creat: 1.1 mg/dL (ref 0.60–1.26)
Globulin: 3.2 g/dL (calc) (ref 1.9–3.7)
Glucose, Bld: 86 mg/dL (ref 65–99)
Potassium: 4.1 mmol/L (ref 3.5–5.3)
Sodium: 139 mmol/L (ref 135–146)
Total Bilirubin: 0.3 mg/dL (ref 0.2–1.2)
Total Protein: 7.5 g/dL (ref 6.1–8.1)

## 2022-09-20 LAB — T-HELPER CELLS (CD4) COUNT (NOT AT ARMC)
Absolute CD4: 419 cells/uL — ABNORMAL LOW (ref 490–1740)
CD4 T Helper %: 23 % — ABNORMAL LOW (ref 30–61)
Total lymphocyte count: 1862 cells/uL (ref 850–3900)

## 2022-09-20 LAB — HIV RNA, RTPCR W/R GT (RTI, PI,INT)
HIV 1 RNA Quant: 51 copies/mL — ABNORMAL HIGH
HIV-1 RNA Quant, Log: 1.71 Log copies/mL — ABNORMAL HIGH

## 2022-09-20 LAB — LIPID PANEL
Cholesterol: 137 mg/dL (ref <200)
HDL: 38 mg/dL — ABNORMAL LOW (ref >=40)
LDL Cholesterol (Calc): 79 mg/dL (calc)
Non-HDL Cholesterol (Calc): 99 mg/dL (calc) (ref <130)
Total CHOL/HDL Ratio: 3.6 (calc) (ref <5.0)
Triglycerides: 114 mg/dL (ref <150)

## 2022-09-20 LAB — RPR: RPR Ser Ql: NONREACTIVE

## 2022-09-21 ENCOUNTER — Telehealth: Payer: Self-pay

## 2022-09-21 NOTE — Telephone Encounter (Signed)
-----   Message from Rosiland Oz, MD sent at 09/21/2022  8:00 AM EST ----- Please let him know his VL is 51, would like to have is under 50 as a goal. Continue ART as discussed.

## 2022-10-03 ENCOUNTER — Other Ambulatory Visit (HOSPITAL_COMMUNITY): Payer: Self-pay

## 2022-10-07 ENCOUNTER — Other Ambulatory Visit: Payer: Self-pay

## 2022-10-07 ENCOUNTER — Other Ambulatory Visit (HOSPITAL_COMMUNITY): Payer: Self-pay

## 2022-10-09 ENCOUNTER — Encounter: Payer: Self-pay | Admitting: Infectious Diseases

## 2022-10-10 ENCOUNTER — Other Ambulatory Visit: Payer: Self-pay

## 2022-10-10 ENCOUNTER — Other Ambulatory Visit (HOSPITAL_COMMUNITY): Payer: Self-pay

## 2022-10-13 ENCOUNTER — Other Ambulatory Visit (HOSPITAL_COMMUNITY): Payer: Self-pay

## 2022-11-01 ENCOUNTER — Other Ambulatory Visit (HOSPITAL_COMMUNITY): Payer: Self-pay

## 2022-11-07 ENCOUNTER — Other Ambulatory Visit: Payer: Self-pay

## 2022-11-07 ENCOUNTER — Other Ambulatory Visit (HOSPITAL_COMMUNITY): Payer: Self-pay

## 2022-11-24 ENCOUNTER — Other Ambulatory Visit (HOSPITAL_COMMUNITY): Payer: Self-pay

## 2022-12-01 ENCOUNTER — Ambulatory Visit
Admission: EM | Admit: 2022-12-01 | Discharge: 2022-12-01 | Disposition: A | Discharge status: Home / Self Care | Payer: Commercial Managed Care - PPO | Attending: Nurse Practitioner | Admitting: Nurse Practitioner

## 2022-12-01 ENCOUNTER — Encounter: Payer: Self-pay | Admitting: Emergency Medicine

## 2022-12-01 DIAGNOSIS — H00011 Hordeolum externum right upper eyelid: Secondary | ICD-10-CM

## 2022-12-01 MED ORDER — DOXYCYCLINE HYCLATE 100 MG PO TABS: 100.0000 mg | ORAL_TABLET | Freq: Two times a day (BID) | ORAL | 0 refills | Status: AC

## 2022-12-01 NOTE — Discharge Instructions (Signed)
Take medication as prescribed. Continue use of warm compresses to the affected eye until symptoms improve. Keep the eye clean and dry. Continue wearing your eyeglasses while symptoms persist. As discussed, if symptoms fail to improve with this treatment, recommend following up with your eye doctor for further evaluation. Follow-up as needed.

## 2022-12-01 NOTE — ED Triage Notes (Signed)
Sty on right upper eye lid since March.  Has been using warm compresses.  States sty was much larger than now.

## 2022-12-01 NOTE — ED Provider Notes (Signed)
[facility]    CSN: [id number] Arrival date & time: 12/01/22  1501      History   Chief Complaint No chief complaint on file.   HPI Eric Underwood is a 32 y.o. male.   The history is provided by the patient.   Patient presents for complaints of stye to the right eye that is been present for the past 2 months.  Patient states that he has been using warm compresses, and keeping the area clean and dry.  He also reports that he has been wearing his glasses since his symptoms started.  Patient states that he cannot get the stye to go away.  Patient denies fever, chills, eye pain, eye drainage, visual changes, visual disturbance, or headache.  Patient states that the stye has decreased in size.  Patient states that he has a history of styes, but has not had 1 to last this long as this one.  History reviewed. No pertinent past medical history.  Patient Active Problem List   Diagnosis Date Noted   Health care maintenance 09/16/2022   Medication monitoring encounter 09/03/2021   Chronic viral hepatitis B without delta agent and without coma (HCC) 09/03/2021   HIV disease (HCC) 07/23/2021   Gonorrhea 07/23/2021   Chlamydia 07/23/2021   Screening for STDs (sexually transmitted diseases) 07/23/2021   Immunization counseling 07/23/2021    History reviewed. No pertinent surgical history.     Home Medications    Prior to Admission medications   Medication Sig Start Date End Date Taking? Authorizing Provider  doxycycline (VIBRA-TABS) 100 MG tablet Take 1 tablet (100 mg total) by mouth 2 (two) times daily for 7 days. 12/01/22 12/08/22 Yes Kaleb Linquist-Warren, Sadie Haber, NP  bictegravir-emtricitabine-tenofovir AF (BIKTARVY) 50-200-25 MG TABS tablet Take 1 tablet by mouth daily. 05/19/22   Odette Fraction, MD    Family History History reviewed. No pertinent family history.  Social History Social History   Tobacco Use   Smoking status: Never   Smokeless tobacco: Never   Vaping Use   Vaping Use: Never used  Substance Use Topics   Alcohol use: No   Drug use: No     Allergies   Penicillins   Review of Systems Review of Systems Per HPI  Physical Exam Triage Vital Signs ED Triage Vitals  Enc Vitals Group     BP 12/01/22 1543 135/82     Pulse Rate 12/01/22 1543 91     Resp 12/01/22 1543 18     Temp 12/01/22 1543 98.8 F (37.1 C)     Temp Source 12/01/22 1543 Oral     SpO2 12/01/22 1543 97 %     Weight --      Height --      Head Circumference --      Peak Flow --      Pain Score 12/01/22 1544 0     Pain Loc --      Pain Edu? --      Excl. in GC? --    No data found.  Updated Vital Signs BP 135/82 (BP Location: Right Arm)   Pulse 91   Temp 98.8 F (37.1 C) (Oral)   Resp 18   SpO2 97%   Visual Acuity Right Eye Distance:   Left Eye Distance:   Bilateral Distance:    Right Eye Near:   Left Eye Near:    Bilateral Near:     Physical Exam Vitals and nursing note reviewed.  Constitutional:  Appearance: Normal appearance.  HENT:     Head: Normocephalic.  Eyes:     General: Vision grossly intact. No visual field deficit.       Right eye: Hordeolum (external, upper eyelid) present.     Extraocular Movements: Extraocular movements intact.     Right eye: Normal extraocular motion and no nystagmus.     Conjunctiva/sclera: Conjunctivae normal.     Right eye: Right conjunctiva is not injected. No chemosis, exudate or hemorrhage.    Pupils: Pupils are equal, round, and reactive to light.  Cardiovascular:     Rate and Rhythm: Normal rate and regular rhythm.     Pulses: Normal pulses.     Heart sounds: Normal heart sounds.  Pulmonary:     Effort: Pulmonary effort is normal. No respiratory distress.     Breath sounds: Normal breath sounds. No stridor. No wheezing, rhonchi or rales.  Abdominal:     General: Bowel sounds are normal.     Palpations: Abdomen is soft.  Musculoskeletal:     Cervical back: Normal range of motion.   Lymphadenopathy:     Cervical: No cervical adenopathy.  Skin:    General: Skin is warm and dry.  Neurological:     General: No focal deficit present.     Mental Status: He is alert and oriented to person, place, and time.  Psychiatric:        Mood and Affect: Mood normal.        Behavior: Behavior normal.      UC Treatments / Results  Labs (all labs ordered are listed, but only abnormal results are displayed) Labs Reviewed - No data to display  EKG   Radiology No results found.  Procedures Procedures (including critical care time)  Medications Ordered in UC Medications - No data to display  Initial Impression / Assessment and Plan / UC Course  I have reviewed the triage vital signs and the nursing notes.  Pertinent labs & imaging results that were available during my care of the patient were reviewed by me and considered in my medical decision making (see chart for details).  The patient is well-appearing, he is in no acute distress, vital signs are stable.  Patient presents for persistent external hordeolum to the right upper eyelid.  Will treat patient empirically with doxycycline 100 mg twice daily for the next 7 days.  Supportive care recommendations were provided and discussed with the patient to include keeping the area clean and dry, continuing to wear his eyeglasses, and continuing warm compresses.  Patient was advised that if symptoms do not improve with this treatment, recommend following up with his eye doctor for further evaluation.  Patient is in agreement with this plan of care and verbalizes understanding.  All questions were answered.  Patient stable for discharge.  Final Clinical Impressions(s) / UC Diagnoses   Final diagnoses:  Hordeolum externum of right upper eyelid     Discharge Instructions      Take medication as prescribed. Continue use of warm compresses to the affected eye until symptoms improve. Keep the eye clean and dry. Continue  wearing your eyeglasses while symptoms persist. As discussed, if symptoms fail to improve with this treatment, recommend following up with your eye doctor for further evaluation. Follow-up as needed.     ED Prescriptions     Medication Sig Dispense Auth. Provider   doxycycline (VIBRA-TABS) 100 MG tablet Take 1 tablet (100 mg total) by mouth 2 (two) times daily for 7  days. 14 tablet Anedra Penafiel-Warren, Sadie Haber, NP      PDMP not reviewed this encounter.   Abran Cantor, NP 12/01/22 1621

## 2022-12-08 ENCOUNTER — Other Ambulatory Visit (HOSPITAL_COMMUNITY): Payer: Self-pay

## 2022-12-09 ENCOUNTER — Other Ambulatory Visit (HOSPITAL_COMMUNITY): Payer: Self-pay

## 2022-12-13 ENCOUNTER — Telehealth: Payer: Self-pay

## 2022-12-13 ENCOUNTER — Other Ambulatory Visit (HOSPITAL_COMMUNITY): Payer: Self-pay

## 2022-12-13 ENCOUNTER — Other Ambulatory Visit: Payer: Self-pay

## 2022-12-13 NOTE — Telephone Encounter (Signed)
Pt called wanting antibiotic eye drops for the stye he was seen for on 5/15. After reviewing the patients' chart I seen that he was sent home on doxycyline and informed him I needed to talk to the provider that is here today to see what she would like to do. Pt also wanted a referral to an ophthalmologist. After speaking to the provider she informed me that if he wanted those drops for his eye then he would have to come in and be seen again. Per provider, we do not do referrals, and beings though he is immunosuppressed he should follow up with infectious diseases for his reoccurring eye styes. Pt states they happen every year.  I called pt back and let him know that he would have to come in and be seen again in order to get the eye drops and he needs to follow up with infectious diseases. Pt verbalized understanding and said he would take that route.

## 2023-01-04 ENCOUNTER — Other Ambulatory Visit (HOSPITAL_COMMUNITY): Payer: Self-pay

## 2023-01-05 ENCOUNTER — Other Ambulatory Visit (HOSPITAL_COMMUNITY): Payer: Self-pay

## 2023-01-06 ENCOUNTER — Other Ambulatory Visit (HOSPITAL_COMMUNITY): Payer: Self-pay

## 2023-01-06 ENCOUNTER — Other Ambulatory Visit: Payer: Self-pay

## 2023-01-06 ENCOUNTER — Telehealth: Payer: Self-pay

## 2023-01-06 NOTE — Telephone Encounter (Signed)
[facility] Patient Advocate Encounter   Received notification from [employer] that prior authorization for Susanne Borders is required.   PA submitted on 01/06/23 Key [id number] Status is pending    [facility] will continue to follow.   Clearance Coots, CPhT Specialty Pharmacy Patient [facility] Phone: [phone] Fax:  [phone]

## 2023-01-10 ENCOUNTER — Other Ambulatory Visit (HOSPITAL_COMMUNITY): Payer: Self-pay

## 2023-01-16 ENCOUNTER — Other Ambulatory Visit (HOSPITAL_COMMUNITY): Payer: Self-pay

## 2023-01-23 ENCOUNTER — Ambulatory Visit: Payer: Commercial Managed Care - PPO | Admitting: Infectious Diseases

## 2023-01-23 ENCOUNTER — Other Ambulatory Visit: Payer: Self-pay

## 2023-01-23 ENCOUNTER — Encounter: Payer: Self-pay | Admitting: Infectious Diseases

## 2023-01-23 VITALS — BP 134/83 | HR 93 | Temp 98.5°F | Wt 204.4 lb

## 2023-01-23 DIAGNOSIS — H00011 Hordeolum externum right upper eyelid: Secondary | ICD-10-CM | POA: Diagnosis not present

## 2023-01-23 DIAGNOSIS — B181 Chronic viral hepatitis B without delta-agent: Secondary | ICD-10-CM | POA: Diagnosis not present

## 2023-01-23 DIAGNOSIS — B20 Human immunodeficiency virus [HIV] disease: Secondary | ICD-10-CM | POA: Diagnosis not present

## 2023-01-23 DIAGNOSIS — Z7185 Encounter for immunization safety counseling: Secondary | ICD-10-CM

## 2023-01-23 DIAGNOSIS — Z5181 Encounter for therapeutic drug level monitoring: Secondary | ICD-10-CM

## 2023-01-23 NOTE — Progress Notes (Addendum)
[address], [place], [place], [address]                                                                  Phn. [phone]; Fax: [phone]                                                                             Date: 01/23/23  Reason for Visit: Routine HIV care.  HPI: Eric Underwood is a 32 y.o.old male with a history of HIV.  Last seen on 09/16/2022. On Biktarvy Lab Results  Component Value Date   HIV1RNAQUANT 51 (H) 09/16/2022   Lab Results  Component Value Date   CD4TABS 319 (L) 05/19/2022   CD4TABS 247 (L) 12/16/2021   Interval hx/current visit: Reports compliance with Biktarvy. Denies missing doses or barriers to adherence of tx. Denies recent sexual activity, no new  sexual partners and declined STD testing today. Denies smoking, alcohol and IVDU. He works as a [employer]. Was seen in the ED 5/16 for external hordeolum of rt upper eye lid for which he was given a course of doxycycline. He has a OP surgery to remove it next with eye doctor. Denies any sadness, hopelessness. Declined vaccines overall. No complaints otherwise.   ROS: As stated in above HPI; all other systems were reviewed and are otherwise negative unless noted below  No reported fever / chills, night sweats, unintentional weight loss, acute visual change, odynophagia, chest pain/pressure, new or worsened SOB or WOB, nausea, vomiting, diarrhea, dysuria, GU discharge, syncope, seizures, red/hot swollen joints, hallucinations / delusions, rashes, new allergies, unusual / excessive bleeding, swollen lymph nodes, or new hospitalizations since the pt was last seen.  PMH/ PSH/ FamHx / Social Hx , medications and allergies reviewed and updated as appropriate; please see corresponding tab in EHR / prior notes  No past  medical history on file.  No past surgical history on file.                                         Current Outpatient Medications on File Prior to Visit  Medication Sig Dispense Refill   bictegravir-emtricitabine-tenofovir AF (BIKTARVY) 50-200-25 MG TABS tablet Take 1 tablet by mouth daily. 30 tablet 11   No current facility-administered medications on file prior to visit.    Allergies  Allergen Reactions   Penicillins     Has patient had a PCN reaction causing immediate rash, facial/tongue/throat swelling, SOB or lightheadedness with hypotension: Unknown Has patient had a PCN reaction causing severe  rash involving mucus membranes or skin necrosis: Unknown Has patient had a PCN reaction that required hospitalization: Unknown Has patient had a PCN reaction occurring within the last 10 years: Unknown If all of the above answers are "NO", then may proceed with Cephalosporin use.    Social History   Socioeconomic History   Marital status: Single    Spouse name: Not on file   Number of children: Not on file   Years of education: Not on file   Highest education level: Not on file  Occupational History   Not on file  Tobacco Use   Smoking status: Never   Smokeless tobacco: Never  Vaping Use   Vaping Use: Never used  Substance and Sexual Activity   Alcohol use: No   Drug use: No   Sexual activity: Not on file    Comment: declined condoms  Other Topics Concern   Not on file  Social History Narrative   Not on file   Social Determinants of Health   Financial Resource Strain: Not on file  Food Insecurity: Not on file  Transportation Needs: Not on file  Physical Activity: Not on file  Stress: Not on file  Social Connections: Not on file  Intimate Partner Violence: Not on file   No family history on file.   Vitals  Wt 204 lb 6.4 oz (92.7 kg)   BMI 26.97 kg/m   Examination  Gen: Alert and oriented x 3, no acute distress HEENT: [place]/AT, no scleral icterus, no pale  conjunctivae, hearing normal, oral mucosa moist, hordeolum in rt upper eye lid - no active signs of infection like redness, tenderness or discharge Neck: Supple Cardio: Regular rate and rhythm Resp: CTAB GI: nondistended GU: Musc: Extremities: No pedal edema Skin: No rashes Neuro: grossly non focal  Psych: Calm, cooperative    Lab Results HIV 1 RNA Quant  Date Value  09/16/2022 51 copies/mL (H)  05/19/2022 NOT DETECTED copies/mL  12/16/2021 22 Copies/mL (H)   CD4 T Cell Abs (/uL)  Date Value  05/19/2022 319 (L)  12/16/2021 247 (L)   No results found for: "HIV1GENOSEQ" Lab Results  Component Value Date   WBC 4.9 07/23/2021   HGB 16.3 07/23/2021   HCT 47.9 07/23/2021   MCV 92.5 07/23/2021   PLT 267 07/23/2021    Lab Results  Component Value Date   CREATININE 1.10 09/16/2022   BUN 13 09/16/2022   NA 139 09/16/2022   K 4.1 09/16/2022   CL 103 09/16/2022   CO2 26 09/16/2022   Lab Results  Component Value Date   ALT 13 09/16/2022   AST 20 09/16/2022   ALKPHOS 52 06/27/2017   BILITOT 0.3 09/16/2022    Lab Results  Component Value Date   CHOL 137 09/16/2022   TRIG 114 09/16/2022   HDL 38 (L) 09/16/2022   LDLCALC 79 09/16/2022   Lab Results  Component Value Date   HAV REACTIVE (A) 09/03/2021   Lab Results  Component Value Date   HEPBSAG REACTIVE (A) 07/23/2021   HEPBSAB NON-REACTIVE 07/23/2021   No results found for: "HCVAB" Lab Results  Component Value Date   CHLAMYDIAWP Negative 02/11/2022   CHLAMYDIAWP Negative 02/11/2022   N Positive (A) 02/11/2022   N Negative 02/11/2022   No results found for: "GCPROBEAPT" No results found for: "QUANTGOLD"  Health Maintenance: Immunization History  Administered Date(s) Administered   Moderna Covid-19 Vaccine Bivalent Booster 24yrs & up 09/03/2021   Moderna SARS-COV2 Booster Vaccination 05/11/2020  Moderna Sars-Covid-2 Vaccination 10/10/2019, 11/12/2019   Pneumococcal Conjugate-13 07/23/2021     Assessment/Plan: # 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 Patients questions were addressed and answered. Safe sex counseling done.  I have personally spent 40 minutes involved in face-to-face and non-face-to-face activities for this patient on the day of the visit.   Electronically signed by: Odette Fraction, MD Infectious Disease Physician [facility] [address] [place], [place] [address] Phone: [phone]  Fax: [phone]

## 2023-01-24 LAB — COMPREHENSIVE METABOLIC PANEL
AG Ratio: 1.4 (calc) (ref 1.0–2.5)
AST: 16 U/L (ref 10–40)
Albumin: 4.5 g/dL (ref 3.6–5.1)
Alkaline phosphatase (APISO): 59 U/L (ref 36–130)
BUN: 12 mg/dL (ref 7–25)
CO2: 27 mmol/L (ref 20–32)
Glucose, Bld: 93 mg/dL (ref 65–99)
Potassium: 4.2 mmol/L (ref 3.5–5.3)
Sodium: 137 mmol/L (ref 135–146)
Total Protein: 7.8 g/dL (ref 6.1–8.1)

## 2023-01-26 LAB — COMPREHENSIVE METABOLIC PANEL
ALT: 9 U/L (ref 9–46)
Calcium: 9.7 mg/dL (ref 8.6–10.3)
Chloride: 102 mmol/L (ref 98–110)
Creat: 1.04 mg/dL (ref 0.60–1.26)
Globulin: 3.3 g/dL (calc) (ref 1.9–3.7)
Total Bilirubin: 0.5 mg/dL (ref 0.2–1.2)

## 2023-01-26 LAB — HIV RNA, RTPCR W/R GT (RTI, PI,INT)
HIV 1 RNA Quant: <20 copies/mL — AB
HIV-1 RNA Quant, Log: <1.3 Log copies/mL — AB

## 2023-01-27 ENCOUNTER — Encounter: Payer: Self-pay | Admitting: Infectious Diseases

## 2023-02-01 ENCOUNTER — Other Ambulatory Visit: Payer: Self-pay

## 2023-02-06 ENCOUNTER — Other Ambulatory Visit (HOSPITAL_COMMUNITY): Payer: Self-pay

## 2023-02-07 ENCOUNTER — Other Ambulatory Visit (HOSPITAL_COMMUNITY): Payer: Self-pay

## 2023-02-16 ENCOUNTER — Other Ambulatory Visit (HOSPITAL_COMMUNITY): Payer: Self-pay

## 2023-03-03 ENCOUNTER — Ambulatory Visit: Payer: Commercial Managed Care - PPO | Admitting: Infectious Diseases

## 2023-03-07 ENCOUNTER — Other Ambulatory Visit (HOSPITAL_COMMUNITY): Payer: Self-pay

## 2023-03-16 ENCOUNTER — Other Ambulatory Visit (HOSPITAL_COMMUNITY): Payer: Self-pay

## 2023-04-03 ENCOUNTER — Other Ambulatory Visit (HOSPITAL_COMMUNITY): Payer: Self-pay

## 2023-04-03 ENCOUNTER — Other Ambulatory Visit: Payer: Self-pay

## 2023-04-10 ENCOUNTER — Other Ambulatory Visit: Payer: Self-pay

## 2023-04-12 ENCOUNTER — Other Ambulatory Visit (HOSPITAL_COMMUNITY): Payer: Self-pay

## 2023-04-27 ENCOUNTER — Other Ambulatory Visit (HOSPITAL_COMMUNITY): Payer: Self-pay

## 2023-05-02 ENCOUNTER — Other Ambulatory Visit (HOSPITAL_COMMUNITY): Payer: Self-pay | Admitting: Pharmacy Technician

## 2023-05-02 ENCOUNTER — Other Ambulatory Visit (HOSPITAL_COMMUNITY): Payer: Self-pay

## 2023-05-02 NOTE — Progress Notes (Signed)
Specialty Pharmacy Refill Coordination Note  Eric Underwood is a 32 y.o. male contacted today regarding refills of specialty medication(s) Bictegravir-Emtricitab-Tenofov   Patient requested [name] at [facility] at [place] date: 05/10/23   Medication will be filled on 05/09/23.

## 2023-05-09 ENCOUNTER — Other Ambulatory Visit: Payer: Self-pay

## 2023-05-09 ENCOUNTER — Other Ambulatory Visit (HOSPITAL_COMMUNITY): Payer: Self-pay

## 2023-05-11 ENCOUNTER — Other Ambulatory Visit (HOSPITAL_COMMUNITY): Payer: Self-pay

## 2023-05-12 ENCOUNTER — Other Ambulatory Visit (HOSPITAL_COMMUNITY): Payer: Self-pay

## 2023-05-31 ENCOUNTER — Other Ambulatory Visit: Payer: Self-pay | Admitting: Infectious Diseases

## 2023-05-31 ENCOUNTER — Other Ambulatory Visit: Payer: Self-pay

## 2023-05-31 DIAGNOSIS — B20 Human immunodeficiency virus [HIV] disease: Secondary | ICD-10-CM

## 2023-05-31 MED ORDER — BIKTARVY 50-200-25 MG PO TABS
1.0000 | ORAL_TABLET | Freq: Every day | ORAL | 0 refills | Status: DC
Start: 2023-05-31 — End: 2023-06-06
  Filled 2023-05-31: qty 30, 30d supply, fill #0

## 2023-05-31 NOTE — Progress Notes (Signed)
Specialty Pharmacy Refill Coordination Note  Eric Underwood is a 32 y.o. male contacted today regarding refills of specialty medication(s) Bictegravir-Emtricitab-Tenofov   Patient requested [name] at [facility] at [place] date: 06/09/23   Medication will be filled on 06/08/23 pending a refill request.

## 2023-06-05 ENCOUNTER — Other Ambulatory Visit (HOSPITAL_COMMUNITY): Payer: Self-pay

## 2023-06-06 ENCOUNTER — Ambulatory Visit (INDEPENDENT_AMBULATORY_CARE_PROVIDER_SITE_OTHER): Payer: Commercial Managed Care - PPO | Admitting: Infectious Diseases

## 2023-06-06 ENCOUNTER — Other Ambulatory Visit: Payer: Self-pay

## 2023-06-06 VITALS — BP 140/87 | HR 90 | Temp 98.5°F | Ht 73.0 in | Wt 202.0 lb

## 2023-06-06 DIAGNOSIS — Z0000 Encounter for general adult medical examination without abnormal findings: Secondary | ICD-10-CM

## 2023-06-06 DIAGNOSIS — B20 Human immunodeficiency virus [HIV] disease: Secondary | ICD-10-CM

## 2023-06-06 DIAGNOSIS — Z113 Encounter for screening for infections with a predominantly sexual mode of transmission: Secondary | ICD-10-CM

## 2023-06-06 DIAGNOSIS — B181 Chronic viral hepatitis B without delta-agent: Secondary | ICD-10-CM

## 2023-06-06 DIAGNOSIS — Z5181 Encounter for therapeutic drug level monitoring: Secondary | ICD-10-CM

## 2023-06-06 DIAGNOSIS — Z2821 Immunization not carried out because of patient refusal: Secondary | ICD-10-CM | POA: Insufficient documentation

## 2023-06-06 MED ORDER — BIKTARVY 50-200-25 MG PO TABS
1.0000 | ORAL_TABLET | Freq: Every day | ORAL | 11 refills | Status: DC
Start: 2023-06-06 — End: 2024-02-20
  Filled 2023-06-06: qty 30, 30d supply, fill #0
  Filled 2023-07-03: qty 30, 30d supply, fill #1
  Filled 2023-08-02: qty 30, 30d supply, fill #2
  Filled 2023-08-28: qty 30, 30d supply, fill #3
  Filled 2023-09-28: qty 30, 30d supply, fill #4
  Filled 2023-10-31 (×2): qty 30, 30d supply, fill #5
  Filled 2023-12-01: qty 30, 30d supply, fill #6
  Filled 2024-01-03 – 2024-01-04 (×2): qty 30, 30d supply, fill #7
  Filled 2024-01-10 – 2024-01-29 (×2): qty 30, 30d supply, fill #8

## 2023-06-06 NOTE — Progress Notes (Signed)
[address], [place], [place], [address]                                                                  Phn. [phone]; Fax: [phone]                                                                             Date: 06/06/23  Reason for Visit: Routine HIV care.  HPI: Eric Underwood is a 32 y.o.old male with a history of HIV.  Last seen on 01/2023. On Biktarvy Lab Results  Component Value Date   HIV1RNAQUANT <20 DETECTED (A) 01/23/2023   Lab Results  Component Value Date   CD4TABS 319 (L) 05/19/2022   CD4TABS 247 (L) 12/16/2021   Interval hx/current visit: Reports compliance with Biktarvy. Denies missing doses or barriers to adherence of tx. Denies recent sexual activity, no new  sexual partners and declined STD testing today. Denies smoking, alcohol and IVDU. He works as a Naval [employer]. He follows dentist. Declined flu and said will get covid vaccine at [facility]. No mental health concerns. No complaints today.   ROS: As stated in above HPI; all other systems were reviewed and are otherwise negative unless noted below  No reported fever / chills, night sweats, unintentional weight loss, acute visual change, odynophagia, chest pain/pressure, new or worsened SOB or WOB, nausea, vomiting, diarrhea, dysuria, GU discharge, syncope, seizures, red/hot swollen joints, hallucinations / delusions, rashes, new allergies, unusual / excessive bleeding, swollen lymph nodes, or new hospitalizations since the pt was last seen.  PMH/ PSH/ FamHx / Social Hx , medications and allergies reviewed and updated as appropriate; please see corresponding tab in EHR / prior notes  No past medical history on file.  No past surgical history on file.                                         Current  Outpatient Medications on File Prior to Visit  Medication Sig Dispense Refill   bictegravir-emtricitabine-tenofovir AF (BIKTARVY) 50-200-25 MG TABS tablet Take 1 tablet by mouth daily. 30 tablet 0   No current facility-administered medications on file prior to visit.    Allergies  Allergen Reactions   Penicillins     Has patient had a PCN reaction causing immediate rash, facial/tongue/throat swelling, SOB or lightheadedness with hypotension: Unknown Has patient had a PCN reaction causing severe rash involving mucus membranes or skin necrosis: Unknown Has patient had a PCN reaction that required hospitalization: Unknown Has patient had a PCN reaction  occurring within the last 10 years: Unknown If all of the above answers are "NO", then may proceed with Cephalosporin use.    Social History   Socioeconomic History   Marital status: Single    Spouse name: Not on file   Number of children: Not on file   Years of education: Not on file   Highest education level: Not on file  Occupational History   Not on file  Tobacco Use   Smoking status: Never   Smokeless tobacco: Never  Vaping Use   Vaping status: Never Used  Substance and Sexual Activity   Alcohol use: No   Drug use: No   Sexual activity: Not on file    Comment: declined condoms  Other Topics Concern   Not on file  Social History Narrative   Not on file   Social Determinants of Health   Financial Resource Strain: Not on file  Food Insecurity: Not on file  Transportation Needs: Not on file  Physical Activity: Not on file  Stress: Not on file  Social Connections: Not on file  Intimate Partner Violence: Not on file   No family history on file.   Vitals  BP (!) 140/87   Pulse 90   Temp 98.5 F (36.9 C) (Temporal)   Ht 6\' 1"  (1.854 m)   Wt 202 lb (91.6 kg)   SpO2 97%   BMI 26.65 kg/m   Examination  Gen:  no acute distress, non toxic appearing  HEENT: [place]/AT, no scleral icterus, no pale conjunctivae, hearing  normal, oral mucosa moist, hordeolum in rt upper eye lid resolved  Neck: Supple Cardio: Regular rate and rhythm Resp: CTAB GI: nondistended GU: Musc: Extremities: No pedal edema Skin: No rashes Neuro: grossly non focal  Psych: Calm, cooperative    Lab Results HIV 1 RNA Quant (copies/mL)  Date Value  01/23/2023 <20 DETECTED (A)  09/16/2022 51 (H)  05/19/2022 NOT DETECTED   CD4 T Cell Abs (/uL)  Date Value  05/19/2022 319 (L)  12/16/2021 247 (L)   No results found for: "HIV1GENOSEQ" Lab Results  Component Value Date   WBC 4.9 07/23/2021   HGB 16.3 07/23/2021   HCT 47.9 07/23/2021   MCV 92.5 07/23/2021   PLT 267 07/23/2021    Lab Results  Component Value Date   CREATININE 1.04 01/23/2023   BUN 12 01/23/2023   NA 137 01/23/2023   K 4.2 01/23/2023   CL 102 01/23/2023   CO2 27 01/23/2023   Lab Results  Component Value Date   ALT 9 01/23/2023   AST 16 01/23/2023   ALKPHOS 52 06/27/2017   BILITOT 0.5 01/23/2023    Lab Results  Component Value Date   CHOL 137 09/16/2022   TRIG 114 09/16/2022   HDL 38 (L) 09/16/2022   LDLCALC 79 09/16/2022   Lab Results  Component Value Date   HAV REACTIVE (A) 09/03/2021   Lab Results  Component Value Date   HEPBSAG REACTIVE (A) 07/23/2021   HEPBSAB NON-REACTIVE 07/23/2021   No results found for: "HCVAB" Lab Results  Component Value Date   CHLAMYDIAWP Negative 02/11/2022   CHLAMYDIAWP Negative 02/11/2022   N Positive (A) 02/11/2022   N Negative 02/11/2022   No results found for: "GCPROBEAPT" No results found for: "QUANTGOLD"  Health Maintenance: Immunization History  Administered Date(s) Administered   Moderna Covid-19 Vaccine Bivalent Booster 63yrs & up 09/03/2021   Moderna SARS-COV2 Booster Vaccination 05/11/2020   Moderna Sars-Covid-2 Vaccination 10/10/2019, 11/12/2019   Pneumococcal  Conjugate-13 07/23/2021    Assessment/Plan: # HIV in a MSM # Mediation Monitoring - Continue Biktarvy, refill sent # 12  months supply - Labs today - Discussed fu in 5-6 months   # External Hordeolum, rt upper eye lid - resolved   # Chronic hepatitis B - on Biktarvy - US abdomen not done due to high copay  # STD screening - Declined   # Immunization Counseling # HM - Following with outside dental clinic - Refused vaccines including Flu. COVID 19 wants to do at outside pharmacy   Patient's labs were reviewed as well as his previous records. Patients questions were addressed and answered. Safe sex counseling done.  I have personally spent 40 minutes involved in face-to-face and non-face-to-face activities for this patient on the day of the visit.   Electronically signed by: Odette Fraction, MD Infectious Disease Physician [facility] for Infectious Disease [address] [place], [place] [address] Phone: [phone]  Fax: [phone]

## 2023-06-07 ENCOUNTER — Other Ambulatory Visit (HOSPITAL_COMMUNITY): Payer: Self-pay

## 2023-06-07 ENCOUNTER — Other Ambulatory Visit: Payer: Self-pay

## 2023-06-07 LAB — T-HELPER CELLS (CD4) COUNT (NOT AT ARMC)
CD4 % Helper T Cell: 30 % — ABNORMAL LOW (ref 33–65)
CD4 T Cell Abs: 615 /uL (ref 400–1790)

## 2023-06-08 ENCOUNTER — Other Ambulatory Visit (HOSPITAL_COMMUNITY): Payer: Self-pay

## 2023-06-09 ENCOUNTER — Telehealth: Payer: Self-pay

## 2023-06-09 ENCOUNTER — Encounter: Payer: Self-pay | Admitting: Infectious Diseases

## 2023-06-09 LAB — COMPREHENSIVE METABOLIC PANEL
AG Ratio: 1.5 (calc) (ref 1.0–2.5)
ALT: 13 U/L (ref 9–46)
AST: 18 U/L (ref 10–40)
Albumin: 4.4 g/dL (ref 3.6–5.1)
Alkaline phosphatase (APISO): 59 U/L (ref 36–130)
BUN: 12 mg/dL (ref 7–25)
CO2: 24 mmol/L (ref 20–32)
Calcium: 9.2 mg/dL (ref 8.6–10.3)
Chloride: 105 mmol/L (ref 98–110)
Creat: 1.18 mg/dL (ref 0.60–1.26)
Globulin: 3 g/dL (ref 1.9–3.7)
Glucose, Bld: 81 mg/dL (ref 65–99)
Potassium: 3.8 mmol/L (ref 3.5–5.3)
Sodium: 140 mmol/L (ref 135–146)
Total Bilirubin: 0.5 mg/dL (ref 0.2–1.2)
Total Protein: 7.4 g/dL (ref 6.1–8.1)

## 2023-06-09 LAB — HIV RNA, RTPCR W/R GT (RTI, PI,INT)
HIV 1 RNA Quant: 23 {copies}/mL — ABNORMAL HIGH
HIV-1 RNA Quant, Log: 1.36 {Log} — ABNORMAL HIGH

## 2023-06-09 LAB — RPR: RPR Ser Ql: NONREACTIVE

## 2023-06-09 NOTE — Telephone Encounter (Signed)
Mychart follow up message sent to the patient.

## 2023-06-09 NOTE — Telephone Encounter (Signed)
-----   Message from Eric Underwood sent at 06/09/2023  8:29 AM EST ----- Please let patient labs with no acute abnormality and he remains virally suppressed ( VL 23).

## 2023-06-13 ENCOUNTER — Other Ambulatory Visit (HOSPITAL_COMMUNITY): Payer: Self-pay

## 2023-07-03 ENCOUNTER — Other Ambulatory Visit: Payer: Self-pay

## 2023-07-03 NOTE — Progress Notes (Signed)
Specialty Pharmacy Refill Coordination Note  Eric Underwood is a 32 y.o. male contacted today regarding refills of specialty medication(s) Bictegravir-Emtricitab-Tenofov Musician)   Patient requested Eric Underwood at [facility] at [place] date: 07/10/23   Medication will be filled on 12.20.24.

## 2023-07-07 ENCOUNTER — Other Ambulatory Visit: Payer: Self-pay

## 2023-07-10 ENCOUNTER — Other Ambulatory Visit (HOSPITAL_COMMUNITY): Payer: Self-pay

## 2023-08-02 ENCOUNTER — Other Ambulatory Visit: Payer: Self-pay

## 2023-08-02 NOTE — Progress Notes (Signed)
 Specialty Pharmacy Refill Coordination Note  Eric Underwood is a 33 y.o. male contacted today regarding refills of specialty medication(s) Bictegravir-Emtricitab-Tenofov (Biktarvy )   Patient requested [name] at [facility] at [place] date: 08/09/23   Medication will be filled on 08/08/23.

## 2023-08-02 NOTE — Progress Notes (Signed)
 Specialty Pharmacy Ongoing Clinical Assessment Note  Eric Underwood is a 33 y.o. male who is being followed by the specialty pharmacy service for RxSp HIV   Patient's specialty medication(s) reviewed today: Bictegravir-Emtricitab-Tenofov (Biktarvy )   Missed doses in the last 4 weeks: 0   Patient/Caregiver did not have any additional questions or concerns.   Therapeutic benefit summary: Patient is achieving benefit   Adverse events/side effects summary: No adverse events/side effects   Patient's therapy is appropriate to: Continue    Goals Addressed             This Visit's Progress    Achieve Undetectable HIV Viral Load < 20       Patient is on track. Patient will maintain adherence. Viral load remains undetectable.          Follow up:  6 months  Eric Underwood Specialty Pharmacist

## 2023-08-08 ENCOUNTER — Other Ambulatory Visit: Payer: Self-pay

## 2023-08-28 ENCOUNTER — Other Ambulatory Visit (HOSPITAL_COMMUNITY): Payer: Self-pay | Admitting: Pharmacy Technician

## 2023-08-28 ENCOUNTER — Other Ambulatory Visit (HOSPITAL_COMMUNITY): Payer: Self-pay

## 2023-08-28 NOTE — Progress Notes (Signed)
 Specialty Pharmacy Refill Coordination Note  Eric Underwood is a 33 y.o. male contacted today regarding refills of specialty medication(s) Bictegravir-Emtricitab-Tenofov (Biktarvy )   Patient requested [name] at [facility] at [place] date: 09/08/23   Medication will be filled on 09/07/23.

## 2023-09-06 ENCOUNTER — Encounter: Payer: Self-pay | Admitting: Infectious Diseases

## 2023-09-07 ENCOUNTER — Other Ambulatory Visit (HOSPITAL_COMMUNITY): Payer: Self-pay

## 2023-09-07 ENCOUNTER — Other Ambulatory Visit: Payer: Self-pay

## 2023-09-27 ENCOUNTER — Other Ambulatory Visit: Payer: Self-pay

## 2023-09-27 ENCOUNTER — Other Ambulatory Visit (HOSPITAL_COMMUNITY): Payer: Self-pay

## 2023-09-27 ENCOUNTER — Encounter: Payer: Self-pay | Admitting: Infectious Diseases

## 2023-09-27 ENCOUNTER — Ambulatory Visit (INDEPENDENT_AMBULATORY_CARE_PROVIDER_SITE_OTHER): Admitting: Infectious Diseases

## 2023-09-27 VITALS — BP 139/87 | HR 113 | Temp 98.4°F | Ht 73.0 in | Wt 202.0 lb

## 2023-09-27 DIAGNOSIS — Z0000 Encounter for general adult medical examination without abnormal findings: Secondary | ICD-10-CM

## 2023-09-27 DIAGNOSIS — Z23 Encounter for immunization: Secondary | ICD-10-CM

## 2023-09-27 DIAGNOSIS — Z5181 Encounter for therapeutic drug level monitoring: Secondary | ICD-10-CM | POA: Diagnosis not present

## 2023-09-27 DIAGNOSIS — B181 Chronic viral hepatitis B without delta-agent: Secondary | ICD-10-CM

## 2023-09-27 DIAGNOSIS — Z113 Encounter for screening for infections with a predominantly sexual mode of transmission: Secondary | ICD-10-CM

## 2023-09-27 DIAGNOSIS — B20 Human immunodeficiency virus [HIV] disease: Secondary | ICD-10-CM

## 2023-09-27 MED ORDER — DOXYCYCLINE HYCLATE 100 MG PO TABS
100.0000 mg | ORAL_TABLET | Freq: Two times a day (BID) | ORAL | 1 refills | Status: DC
  Filled 2023-09-27: qty 60, 30d supply, fill #0

## 2023-09-27 NOTE — Progress Notes (Addendum)
 [address], [place], [place], [address]                                                                  Phn. [phone]; Fax: [phone]                                                                             Date: 09/27/23  Reason for Visit: Routine HIV care.  HPI: Eric Underwood is a 33 y.o.old male with a history of HIV.  Last seen on 06/06/23. Lab Results  Component Value Date   HIV1RNAQUANT 23 (H) 06/06/2023   Lab Results  Component Value Date   CD4TABS 615 06/06/2023   CD4TABS 319 (L) 05/19/2022   CD4TABS 247 (L) 12/16/2021   Interval hx/current visit: Reports compliance with Biktarvy. Denies missing doses or barriers to adherence of tx. Sexually active on and off with same male partner. Willing to do STD screening and HPV Vaccine. He is willing to get Doxy PEP. Reports his weight is fluctuating but no significant changes and recently found out that biktarvy can cause weight gain. No other complaints.   ROS: As stated in above HPI; all other systems were reviewed and are otherwise negative unless noted below  No reported fever / chills, night sweats, unintentional weight loss, acute visual change, odynophagia, chest pain/pressure, new or worsened SOB or WOB, nausea, vomiting, diarrhea, dysuria, GU discharge, syncope, seizures, red/hot swollen joints, hallucinations / delusions, rashes, new allergies, unusual / excessive bleeding, swollen lymph nodes, or new hospitalizations since the pt was last seen.  PMH/ PSH/ FamHx / Social Hx , medications and allergies reviewed and updated as appropriate; please see corresponding tab in EHR / prior notes  No past medical history on file.  No past surgical history on file.                                         Current  Outpatient Medications on File Prior to Visit  Medication Sig Dispense Refill   bictegravir-emtricitabine-tenofovir AF (BIKTARVY) 50-200-25 MG TABS tablet Take 1 tablet by mouth daily. 30 tablet 11   No current facility-administered medications on file prior to visit.    Allergies  Allergen Reactions   Penicillins     Has patient had a PCN reaction causing immediate rash, facial/tongue/throat swelling, SOB or lightheadedness with hypotension: Unknown Has patient had a PCN reaction causing severe rash involving mucus membranes or skin necrosis: Unknown Has patient had a PCN reaction that required hospitalization: Unknown Has patient had  a PCN reaction occurring within the last 10 years: Unknown If all of the above answers are "NO", then may proceed with Cephalosporin use.    Social History   Socioeconomic History   Marital status: Single    Spouse name: Not on file   Number of children: Not on file   Years of education: Not on file   Highest education level: Not on file  Occupational History   Not on file  Tobacco Use   Smoking status: Never   Smokeless tobacco: Never  Vaping Use   Vaping status: Never Used  Substance and Sexual Activity   Alcohol use: No   Drug use: No   Sexual activity: Not on file    Comment: declined condoms  Other Topics Concern   Not on file  Social History Narrative   Not on file   Social Drivers of Health   Financial Resource Strain: Not on file  Food Insecurity: Not on file  Transportation Needs: Not on file  Physical Activity: Not on file  Stress: Not on file  Social Connections: Not on file  Intimate Partner Violence: Not on file   No family history on file.   Vitals  BP 139/87   Pulse (!) 113   Temp 98.4 F (36.9 C) (Oral)   Ht 6\' 1"  (1.854 m)   Wt 202 lb (91.6 kg)   SpO2 100%   BMI 26.65 kg/m   Examination  Gen:  no acute distress, non toxic appearing  HEENT: [place]/AT, no scleral icterus, no pale conjunctivae, hearing  normal, oral mucosa moist Neck: Supple Cardio: Regular rate and rhythm Resp: CTAB GI: nondistended GU: Musc: Extremities: No pedal edema Skin: No rashes Neuro: grossly non focal  Psych: Calm, cooperative  Lab Results HIV 1 RNA Quant (copies/mL)  Date Value  06/06/2023 23 (H)  01/23/2023 <20 DETECTED (A)  09/16/2022 51 (H)   CD4 T Cell Abs (/uL)  Date Value  06/06/2023 615  05/19/2022 319 (L)  12/16/2021 247 (L)   No results found for: "HIV1GENOSEQ" Lab Results  Component Value Date   WBC 4.9 07/23/2021   HGB 16.3 07/23/2021   HCT 47.9 07/23/2021   MCV 92.5 07/23/2021   PLT 267 07/23/2021    Lab Results  Component Value Date   CREATININE 1.18 06/06/2023   BUN 12 06/06/2023   NA 140 06/06/2023   K 3.8 06/06/2023   CL 105 06/06/2023   CO2 24 06/06/2023   Lab Results  Component Value Date   ALT 13 06/06/2023   AST 18 06/06/2023   ALKPHOS 52 06/27/2017   BILITOT 0.5 06/06/2023    Lab Results  Component Value Date   CHOL 137 09/16/2022   TRIG 114 09/16/2022   HDL 38 (L) 09/16/2022   LDLCALC 79 09/16/2022   Lab Results  Component Value Date   HAV REACTIVE (A) 09/03/2021   Lab Results  Component Value Date   HEPBSAG REACTIVE (A) 07/23/2021   HEPBSAB NON-REACTIVE 07/23/2021   No results found for: "HCVAB" Lab Results  Component Value Date   CHLAMYDIAWP Negative 02/11/2022   CHLAMYDIAWP Negative 02/11/2022   N Positive (A) 02/11/2022   N Negative 02/11/2022   No results found for: "GCPROBEAPT" No results found for: "QUANTGOLD"  Health Maintenance: Immunization History  Administered Date(s) Administered   Moderna Covid-19 Vaccine Bivalent Booster 43yrs & up 09/03/2021   Moderna SARS-COV2 Booster Vaccination 05/11/2020   Moderna Sars-Covid-2 Vaccination 10/10/2019, 11/12/2019   Pneumococcal Conjugate-13 07/23/2021  Assessment/Plan: # 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 Refused other vaccines including Flu.  - Other due vaccines: PCV 20, covid booster, menveo, monkey pox  # HM - Following with outside dental clinic - Lipid panel today   Patient's labs were reviewed as well as his previous records. Patients questions were addressed and answered. Safe sex counseling done.  I have personally spent 30 minutes involved in face-to-face and non-face-to-face activities for this patient on the day of the visit.   Electronically signed by: Odette Fraction, MD Infectious Disease Physician [facility] for Infectious Disease [address] [place], [place] [address] Phone: [phone]  Fax: [phone]

## 2023-09-28 ENCOUNTER — Other Ambulatory Visit: Payer: Self-pay

## 2023-09-28 ENCOUNTER — Other Ambulatory Visit (HOSPITAL_COMMUNITY): Payer: Self-pay

## 2023-09-28 LAB — T-HELPER CELLS (CD4) COUNT (NOT AT ARMC)
CD4 % Helper T Cell: 28 % — ABNORMAL LOW (ref 33–65)
CD4 T Cell Abs: 695 /uL (ref 400–1790)

## 2023-09-28 MED ORDER — DOXYCYCLINE HYCLATE 100 MG PO TABS
200.0000 mg | ORAL_TABLET | ORAL | 1 refills | Status: AC | PRN
Start: 2023-09-28 — End: ?
  Filled 2023-09-28: qty 60, fill #0
  Filled 2023-12-16: qty 60, 30d supply, fill #0
  Filled 2024-04-30: qty 60, 30d supply, fill #1

## 2023-09-28 NOTE — Progress Notes (Signed)
 Specialty Pharmacy Refill Coordination Note  Eric Underwood is a 33 y.o. male contacted today regarding refills of specialty medication(s) Bictegravir-Emtricitab-Tenofov Musician)   Patient requested (Patient-Rptd) Pickup at [facility] at [employer] date: (Patient-Rptd) 10/13/23   Medication will be filled on 10/12/23.

## 2023-09-28 NOTE — Addendum Note (Signed)
 Addended by: Odette Fraction on: 09/28/2023 12:45 PM   Modules accepted: Orders

## 2023-09-30 LAB — COMPREHENSIVE METABOLIC PANEL
AG Ratio: 1.4 (calc) (ref 1.0–2.5)
ALT: 15 U/L (ref 9–46)
AST: 19 U/L (ref 10–40)
Albumin: 4.6 g/dL (ref 3.6–5.1)
Alkaline phosphatase (APISO): 59 U/L (ref 36–130)
BUN: 12 mg/dL (ref 7–25)
CO2: 29 mmol/L (ref 20–32)
Calcium: 9.6 mg/dL (ref 8.6–10.3)
Chloride: 102 mmol/L (ref 98–110)
Creat: 1.09 mg/dL (ref 0.60–1.26)
Globulin: 3.3 g/dL (ref 1.9–3.7)
Glucose, Bld: 79 mg/dL (ref 65–99)
Potassium: 4.1 mmol/L (ref 3.5–5.3)
Sodium: 140 mmol/L (ref 135–146)
Total Bilirubin: 0.5 mg/dL (ref 0.2–1.2)
Total Protein: 7.9 g/dL (ref 6.1–8.1)

## 2023-09-30 LAB — LIPID PANEL
Cholesterol: 192 mg/dL (ref <200)
HDL: 38 mg/dL — ABNORMAL LOW (ref >=40)
LDL Cholesterol (Calc): 114 mg/dL — ABNORMAL HIGH
Non-HDL Cholesterol (Calc): 154 mg/dL — ABNORMAL HIGH (ref <130)
Total CHOL/HDL Ratio: 5.1 (calc) — ABNORMAL HIGH (ref <5.0)
Triglycerides: 282 mg/dL — ABNORMAL HIGH (ref <150)

## 2023-09-30 LAB — CBC
HCT: 46.9 % (ref 38.5–50.0)
Hemoglobin: 16 g/dL (ref 13.2–17.1)
MCH: 31.3 pg (ref 27.0–33.0)
MCHC: 34.1 g/dL (ref 32.0–36.0)
MCV: 91.6 fL (ref 80.0–100.0)
MPV: 10.3 fL (ref 7.5–12.5)
Platelets: 289 10*3/uL (ref 140–400)
RBC: 5.12 10*6/uL (ref 4.20–5.80)
RDW: 12.8 % (ref 11.0–15.0)
WBC: 7.2 10*3/uL (ref 3.8–10.8)

## 2023-09-30 LAB — HEPATITIS B E ANTIBODY: Hep B E Ab: REACTIVE — AB

## 2023-09-30 LAB — HIV RNA, RTPCR W/R GT (RTI, PI,INT)
HIV 1 RNA Quant: <20 {copies}/mL — AB
HIV-1 RNA Quant, Log: <1.3 {Log_copies}/mL — AB

## 2023-09-30 LAB — HEPATITIS B CORE ANTIBODY, TOTAL: Hep B Core Total Ab: REACTIVE — AB

## 2023-09-30 LAB — RPR: RPR Ser Ql: NONREACTIVE

## 2023-09-30 LAB — HEPATITIS B DNA, ULTRAQUANTITATIVE, PCR
Hepatitis B DNA: NOT DETECTED [IU]/mL
Hepatitis B virus DNA: NOT DETECTED {Log_IU}/mL

## 2023-09-30 LAB — HEPATITIS B SURFACE ANTIGEN: Hepatitis B Surface Ag: NONREACTIVE

## 2023-09-30 LAB — HEPATITIS B SURFACE ANTIBODY,QUALITATIVE: Hep B S Ab: REACTIVE — AB

## 2023-09-30 LAB — HEPATITIS B E ANTIGEN: Hep B E Ag: NONREACTIVE

## 2023-10-02 ENCOUNTER — Telehealth: Payer: Self-pay

## 2023-10-02 NOTE — Telephone Encounter (Signed)
-----   Message from Victoriano Lain sent at 10/02/2023  7:55 AM EDT ----- Please let patient know lab work is negative for any acute abnormality.   LDL is elevated @ 114. Recommend to eat more healthy diet including fruits, vegetables and regular exercise.

## 2023-10-11 ENCOUNTER — Other Ambulatory Visit: Payer: Self-pay

## 2023-10-12 ENCOUNTER — Other Ambulatory Visit: Payer: Self-pay

## 2023-10-31 ENCOUNTER — Other Ambulatory Visit: Payer: Self-pay

## 2023-10-31 ENCOUNTER — Other Ambulatory Visit (HOSPITAL_COMMUNITY): Payer: Self-pay

## 2023-10-31 NOTE — Progress Notes (Signed)
 Specialty Pharmacy Refill Coordination Note  Chadd Tollison Wunschel is a 33 y.o. male contacted today regarding refills of specialty medication(s) Biktarvy.  Patient requested (Patient-Rptd) Pickup at [facility] date: (Patient-Rptd) 11/09/23   Medication will be filled on 11/08/23.

## 2023-11-08 ENCOUNTER — Other Ambulatory Visit: Payer: Self-pay

## 2023-12-01 ENCOUNTER — Other Ambulatory Visit: Payer: Self-pay

## 2023-12-01 NOTE — Progress Notes (Signed)
 Specialty Pharmacy Refill Coordination Note  Eric Underwood is a 33 y.o. male contacted today regarding refills of specialty medication(s) Bictegravir-Emtricitab-Tenofov (Biktarvy )   Patient requested (Patient-Rptd) Pickup at [facility] at [facility] date: (Patient-Rptd) 12/07/23   Medication will be filled on 12/06/23.

## 2023-12-06 ENCOUNTER — Other Ambulatory Visit: Payer: Self-pay

## 2023-12-12 ENCOUNTER — Ambulatory Visit: Payer: Commercial Managed Care - PPO | Admitting: Infectious Diseases

## 2023-12-16 ENCOUNTER — Other Ambulatory Visit: Payer: Self-pay

## 2024-01-03 ENCOUNTER — Encounter (INDEPENDENT_AMBULATORY_CARE_PROVIDER_SITE_OTHER): Payer: Self-pay

## 2024-01-03 ENCOUNTER — Other Ambulatory Visit: Payer: Self-pay

## 2024-01-04 ENCOUNTER — Other Ambulatory Visit: Payer: Self-pay

## 2024-01-04 NOTE — Progress Notes (Signed)
 Specialty Pharmacy Refill Coordination Note  Eric Underwood is a 33 y.o. male contacted today regarding refills of specialty medication(s) Bictegravir-Emtricitab-Tenofov (Biktarvy )   Patient requested [name] at [facility] at [place] date: 01/10/24   Medication will be filled on 01/09/24.

## 2024-01-09 ENCOUNTER — Other Ambulatory Visit: Payer: Self-pay

## 2024-01-09 ENCOUNTER — Other Ambulatory Visit (HOSPITAL_COMMUNITY): Payer: Self-pay

## 2024-01-10 ENCOUNTER — Other Ambulatory Visit: Payer: Self-pay

## 2024-01-14 ENCOUNTER — Encounter: Payer: Self-pay | Admitting: Infectious Diseases

## 2024-01-22 ENCOUNTER — Other Ambulatory Visit (HOSPITAL_COMMUNITY): Payer: Self-pay

## 2024-01-29 ENCOUNTER — Other Ambulatory Visit: Payer: Self-pay

## 2024-01-29 ENCOUNTER — Other Ambulatory Visit (HOSPITAL_COMMUNITY): Payer: Self-pay

## 2024-01-29 ENCOUNTER — Encounter (INDEPENDENT_AMBULATORY_CARE_PROVIDER_SITE_OTHER): Payer: Self-pay

## 2024-01-29 NOTE — Progress Notes (Signed)
 Specialty Pharmacy Refill Coordination Note  MyChart Questionnaire Submission  Eric Underwood is a 33 y.o. male contacted today regarding refills of specialty medication(s) Biktarvy .  Patient requested: (Patient-Rptd) Pickup at [facility] at [facility] date: (Patient-Rptd) 02/08/24  Medication will be filled on 02/07/24.

## 2024-02-07 ENCOUNTER — Other Ambulatory Visit: Payer: Self-pay

## 2024-02-16 ENCOUNTER — Other Ambulatory Visit (HOSPITAL_COMMUNITY): Payer: Self-pay

## 2024-02-20 ENCOUNTER — Other Ambulatory Visit: Payer: Self-pay

## 2024-02-20 ENCOUNTER — Other Ambulatory Visit (HOSPITAL_COMMUNITY): Payer: Self-pay

## 2024-02-20 ENCOUNTER — Ambulatory Visit: Admitting: Infectious Diseases

## 2024-02-20 ENCOUNTER — Encounter: Payer: Self-pay | Admitting: Infectious Diseases

## 2024-02-20 VITALS — BP 133/87 | HR 96 | Temp 98.0°F | Ht 73.0 in | Wt 218.0 lb

## 2024-02-20 DIAGNOSIS — B20 Human immunodeficiency virus [HIV] disease: Secondary | ICD-10-CM | POA: Diagnosis not present

## 2024-02-20 DIAGNOSIS — Z79899 Other long term (current) drug therapy: Secondary | ICD-10-CM | POA: Diagnosis not present

## 2024-02-20 DIAGNOSIS — B181 Chronic viral hepatitis B without delta-agent: Secondary | ICD-10-CM | POA: Diagnosis not present

## 2024-02-20 DIAGNOSIS — R635 Abnormal weight gain: Secondary | ICD-10-CM

## 2024-02-20 DIAGNOSIS — Z0000 Encounter for general adult medical examination without abnormal findings: Secondary | ICD-10-CM | POA: Diagnosis not present

## 2024-02-20 DIAGNOSIS — E785 Hyperlipidemia, unspecified: Secondary | ICD-10-CM | POA: Diagnosis not present

## 2024-02-20 DIAGNOSIS — Z113 Encounter for screening for infections with a predominantly sexual mode of transmission: Secondary | ICD-10-CM

## 2024-02-20 DIAGNOSIS — Z23 Encounter for immunization: Secondary | ICD-10-CM

## 2024-02-20 DIAGNOSIS — Z7185 Encounter for immunization safety counseling: Secondary | ICD-10-CM | POA: Insufficient documentation

## 2024-02-20 MED ORDER — DOVATO 50-300 MG PO TABS
1.0000 | ORAL_TABLET | Freq: Every day | ORAL | 5 refills | Status: DC
  Filled 2024-02-20: qty 30, 30d supply, fill #0

## 2024-02-20 MED ORDER — BIKTARVY 50-200-25 MG PO TABS
1.0000 | ORAL_TABLET | Freq: Every day | ORAL | 11 refills | Status: AC
  Filled 2024-02-21 (×2): qty 30, 30d supply, fill #0
  Filled 2024-03-25: qty 30, 30d supply, fill #1
  Filled 2024-04-30: qty 30, 30d supply, fill #2
  Filled 2024-05-23 (×2): qty 30, 30d supply, fill #3
  Filled 2024-06-27: qty 30, 30d supply, fill #4
  Filled 2024-07-25 – 2024-07-30 (×2): qty 30, 30d supply, fill #5

## 2024-02-20 NOTE — Progress Notes (Signed)
 Specialty Pharmacy Refill Coordination Note  Eric Underwood is a 33 y.o. male contacted today regarding refills of specialty medication(s) Dolutegravir -lamiVUDine  (Dovato )   Patient requested Pickup at [facility] at [place] date: 02/22/24   Medication will be filled on 02/21/24.

## 2024-02-20 NOTE — Progress Notes (Signed)
 [address], [place], [place], [address]                                                                  Phn. [phone]; Fax: [phone]                                                                             Date: 02/20/24  Reason for Visit: Routine HIV care.  HPI: Eric Underwood is a 33 y.o.old male with a history of HIV who is here for regular fu.   Interval hx/current visit: Compliant with Biktarvy  without missed doses but concerned about weight gain around over 30 lbs since being started on Biktarvy  despite eating healthy, and exercising in the weekends. Denies being sexually active and refused screening. Agreed to HPV#2. No other complaints.   ROS: As stated in above HPI; all other systems were reviewed and are otherwise negative unless noted below  No reported fever / chills, night sweats, unintentional weight loss, acute visual change, odynophagia, chest pain/pressure, new or worsened SOB or WOB, nausea, vomiting, diarrhea, dysuria, GU discharge, syncope, seizures, red/hot swollen joints, hallucinations / delusions, rashes, new allergies, unusual / excessive bleeding, swollen lymph nodes, or new hospitalizations since the pt was last seen.  PMH/ PSH/ FamHx / Social Hx , medications and allergies reviewed and updated as appropriate; please see corresponding tab in EHR / prior notes  No past medical history on file.  No past surgical history on file.                                         Current Outpatient Medications on File Prior to Visit  Medication Sig Dispense Refill   bictegravir-emtricitabine -tenofovir  AF (BIKTARVY ) 50-200-25 MG TABS tablet Take 1 tablet by mouth daily. 30 tablet 11   doxycycline  (VIBRA -TABS) 100 MG tablet Take 2 tablets (200 mg total) by mouth as  needed. Take 2 tabs within 24 hrs of condomless sex, no later than 72 hrs,  max dose 200mg  per day 60 tablet 1   No current facility-administered medications on file prior to visit.    Allergies  Allergen Reactions   Penicillins     Has patient had a PCN reaction causing immediate rash, facial/tongue/throat swelling, SOB or lightheadedness with hypotension: Unknown Has patient had a PCN reaction causing severe rash involving mucus membranes or skin necrosis: Unknown Has patient had a PCN reaction that required hospitalization: Unknown Has patient had a PCN reaction occurring within the last 10 years: Unknown If all of the  above answers are NO, then may proceed with Cephalosporin use.    Social History   Socioeconomic History   Marital status: Single    Spouse name: Not on file   Number of children: Not on file   Years of education: Not on file   Highest education level: Not on file  Occupational History   Not on file  Tobacco Use   Smoking status: Never   Smokeless tobacco: Never  Vaping Use   Vaping status: Never Used  Substance and Sexual Activity   Alcohol use: No   Drug use: No   Sexual activity: Not on file    Comment: declined condoms  Other Topics Concern   Not on file  Social History Narrative   Not on file   Social Drivers of Health   Financial Resource Strain: Not on file  Food Insecurity: Not on file  Transportation Needs: Not on file  Physical Activity: Not on file  Stress: Not on file  Social Connections: Not on file  Intimate Partner Violence: Not on file   No family history on file.   Vitals  BP 133/87   Pulse 96   Temp 98 F (36.7 C) (Temporal)   Ht 6' 1 (1.854 m)   Wt 218 lb (98.9 kg)   SpO2 100%   BMI 28.76 kg/m   Examination  Gen:  no acute distress, non toxic appearing  HEENT: [place]/AT, no scleral icterus, no pale conjunctivae, hearing normal, oral mucosa moist Neck: Supple Cardio: Regular rate and rhythm Resp: Normal  respiratory effort GI: nondistended GU: Musc: Extremities: No pedal edema Skin: No rashes Neuro: grossly non focal  Psych: Calm, cooperative  Lab Results HIV 1 RNA Quant (copies/mL)  Date Value  09/27/2023 <20 DETECTED (A)  06/06/2023 23 (H)  01/23/2023 <20 DETECTED (A)   CD4 T Cell Abs (/uL)  Date Value  09/27/2023 695  06/06/2023 615  05/19/2022 319 (L)   No results found for: HIV1GENOSEQ Lab Results  Component Value Date   WBC 7.2 09/27/2023   HGB 16.0 09/27/2023   HCT 46.9 09/27/2023   MCV 91.6 09/27/2023   PLT 289 09/27/2023    Lab Results  Component Value Date   CREATININE 1.09 09/27/2023   BUN 12 09/27/2023   NA 140 09/27/2023   K 4.1 09/27/2023   CL 102 09/27/2023   CO2 29 09/27/2023   Lab Results  Component Value Date   ALT 15 09/27/2023   AST 19 09/27/2023   ALKPHOS 52 06/27/2017   BILITOT 0.5 09/27/2023    Lab Results  Component Value Date   CHOL 192 09/27/2023   TRIG 282 (H) 09/27/2023   HDL 38 (L) 09/27/2023   LDLCALC 114 (H) 09/27/2023   Lab Results  Component Value Date   HAV REACTIVE (A) 09/03/2021   Lab Results  Component Value Date   HEPBSAG NON-REACTIVE 09/27/2023   HEPBSAB REACTIVE (A) 09/27/2023   No results found for: HCVAB Lab Results  Component Value Date   CHLAMYDIAWP Negative 02/11/2022   CHLAMYDIAWP Negative 02/11/2022   N Positive (A) 02/11/2022   N Negative 02/11/2022   No results found for: GCPROBEAPT No results found for: QUANTGOLD  Health Maintenance: Immunization History  Administered Date(s) Administered   HPV 9-valent 09/27/2023   Moderna Covid-19 Vaccine Bivalent Booster 36yrs & up 09/03/2021   Moderna SARS-COV2 Booster Vaccination 05/11/2020   Moderna Sars-Covid-2 Vaccination 10/10/2019, 11/12/2019   Pneumococcal Conjugate-13 07/23/2021    Assessment/Plan: # 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  - Labs today - Fu in 6 months  # Weight gain/Hyperlipidemia - Gained around 34 lbs since 07/2021 per EMR, BMI at 28.76 - discussed about avoiding fried, sugary food, soda, pasta, more fruits and vegetables as well as exercising approx 150 mins of moderate intensity exercise every week  # Chronic hepatitis B - on Biktarvy  - Labs today - US  abdomen not done due to high copay  # STD screening - Declined screening - Aware about Doxy PEP  # Immunization Counseling - HPV vaccine # 2  - Other due vaccines: PCV 20, covid booster, menveo, monkey pox  # Health care maintenance  - Following with outside dental clinic - LDL 114  Patient's labs were reviewed as well as his previous records. Patients questions were addressed and answered. Safe sex counseling done.  I spent 25 minutes involved in face-to-face and non-face-to-face activities for this patient on the day of the visit including review of prior notes, ordering labs and vaccine, documentation of notes in EMR, communicating with patient, counseling of the patient.   Electronically signed by: Annalee Orem, MD Infectious Disease Physician [facility] [address] [place], [place] [address] Phone: [phone]  Fax: [phone]

## 2024-02-21 ENCOUNTER — Other Ambulatory Visit: Payer: Self-pay

## 2024-02-21 ENCOUNTER — Other Ambulatory Visit (HOSPITAL_COMMUNITY): Payer: Self-pay

## 2024-02-21 LAB — T-HELPER CELLS (CD4) COUNT (NOT AT ARMC)
CD4 % Helper T Cell: 29 % — ABNORMAL LOW (ref 33–65)
CD4 T Cell Abs: 710 /uL (ref 400–1790)

## 2024-02-21 NOTE — Progress Notes (Addendum)
 Specialty Pharmacy Initial Fill Coordination Note  Eric Underwood is a 33 y.o. male contacted today regarding initial fill of specialty medication(s) Bictegravir-Emtricitab-Tenofov (Biktarvy )   Patient requested Marylyn at [facility] at [place] date: 02/29/24   Medication will be filled on 02/28/24.   Patient is aware of $0 copayment.

## 2024-02-23 ENCOUNTER — Ambulatory Visit: Payer: Self-pay | Admitting: Infectious Diseases

## 2024-02-23 LAB — COMPREHENSIVE METABOLIC PANEL WITH GFR
AG Ratio: 1.4 (calc) (ref 1.0–2.5)
ALT: 15 U/L (ref 9–46)
AST: 25 U/L (ref 10–40)
Albumin: 4.7 g/dL (ref 3.6–5.1)
Alkaline phosphatase (APISO): 54 U/L (ref 36–130)
BUN: 16 mg/dL (ref 7–25)
CO2: 27 mmol/L (ref 20–32)
Calcium: 9.4 mg/dL (ref 8.6–10.3)
Chloride: 103 mmol/L (ref 98–110)
Creat: 1.13 mg/dL (ref 0.60–1.26)
Globulin: 3.4 g/dL (ref 1.9–3.7)
Glucose, Bld: 75 mg/dL (ref 65–99)
Potassium: 3.9 mmol/L (ref 3.5–5.3)
Sodium: 139 mmol/L (ref 135–146)
Total Bilirubin: 0.6 mg/dL (ref 0.2–1.2)
Total Protein: 8.1 g/dL (ref 6.1–8.1)
eGFR: 89 mL/min/1.73m2 (ref >=60)

## 2024-02-23 LAB — HIV RNA, RTPCR W/R GT (RTI, PI,INT)
HIV 1 RNA Quant: <20 {copies}/mL — AB
HIV-1 RNA Quant, Log: <1.3 {Log_copies}/mL — AB

## 2024-02-23 LAB — RPR: RPR Ser Ql: NONREACTIVE

## 2024-02-28 ENCOUNTER — Other Ambulatory Visit: Payer: Self-pay

## 2024-02-29 ENCOUNTER — Other Ambulatory Visit: Payer: Self-pay

## 2024-03-22 ENCOUNTER — Other Ambulatory Visit (HOSPITAL_COMMUNITY): Payer: Self-pay

## 2024-03-24 ENCOUNTER — Encounter (INDEPENDENT_AMBULATORY_CARE_PROVIDER_SITE_OTHER): Payer: Self-pay

## 2024-03-25 ENCOUNTER — Other Ambulatory Visit: Payer: Self-pay

## 2024-03-25 ENCOUNTER — Other Ambulatory Visit (HOSPITAL_COMMUNITY): Payer: Self-pay

## 2024-03-25 NOTE — Progress Notes (Signed)
 Specialty Pharmacy Refill Coordination Note  MyChart Questionnaire Submission  Eric Underwood is a 33 y.o. male contacted today regarding refills of specialty medication(s) Biktarvy .  Doses on hand: (Patient-Rptd) 17   Patient requested: (Patient-Rptd) Pickup at [facility] at [facility] date: 04/04/24  Medication will be filled on 04/03/24.

## 2024-04-03 ENCOUNTER — Other Ambulatory Visit: Payer: Self-pay

## 2024-04-09 ENCOUNTER — Ambulatory Visit: Admitting: Infectious Diseases

## 2024-04-30 ENCOUNTER — Other Ambulatory Visit: Payer: Self-pay

## 2024-04-30 NOTE — Progress Notes (Signed)
 Specialty Pharmacy Ongoing Clinical Assessment Note  Eric Underwood is a 33 y.o. male who is being followed by the specialty pharmacy service for RxSp HIV   Patient's specialty medication(s) reviewed today: Bictegravir-Emtricitab-Tenofov (Biktarvy )   Missed doses in the last 4 weeks: 0   Patient/Caregiver did not have any additional questions or concerns.   Therapeutic benefit summary: Patient is achieving benefit   Adverse events/side effects summary: No adverse events/side effects   Patient's therapy is appropriate to: Continue    Goals Addressed             This Visit's Progress    Achieve Undetectable HIV Viral Load < 20   On track    Patient is on track. Patient will maintain adherence. Viral load remains undetectable.          Follow up: 12 months  [facility]

## 2024-04-30 NOTE — Progress Notes (Signed)
 Specialty Pharmacy Refill Coordination Note  Eric Underwood is a 33 y.o. male contacted today regarding refills of specialty medication(s) Bictegravir-Emtricitab-Tenofov (Biktarvy )   Patient requested Marylyn at [facility] at [place] date: 05/03/24   Medication will be filled on 05/02/24.

## 2024-05-02 ENCOUNTER — Other Ambulatory Visit: Payer: Self-pay

## 2024-05-23 ENCOUNTER — Other Ambulatory Visit: Payer: Self-pay | Admitting: Pharmacy Technician

## 2024-05-23 ENCOUNTER — Encounter (INDEPENDENT_AMBULATORY_CARE_PROVIDER_SITE_OTHER): Payer: Self-pay

## 2024-05-23 ENCOUNTER — Other Ambulatory Visit (HOSPITAL_COMMUNITY): Payer: Self-pay

## 2024-05-23 ENCOUNTER — Other Ambulatory Visit: Payer: Self-pay

## 2024-05-23 NOTE — Progress Notes (Signed)
 Specialty Pharmacy Refill Coordination Note  Eric Underwood is a 33 y.o. male contacted today regarding refills of specialty medication(s) Bictegravir-Emtricitab-Tenofov (Biktarvy )   Patient requested (Patient-Rptd) Pickup at [facility] at [facility] date: (Patient-Rptd) 05/31/24   Medication will be filled on: 05/30/2024

## 2024-05-30 ENCOUNTER — Other Ambulatory Visit (HOSPITAL_COMMUNITY): Payer: Self-pay

## 2024-05-30 ENCOUNTER — Other Ambulatory Visit: Payer: Self-pay

## 2024-06-27 ENCOUNTER — Other Ambulatory Visit: Payer: Self-pay

## 2024-06-27 ENCOUNTER — Other Ambulatory Visit (HOSPITAL_COMMUNITY): Payer: Self-pay

## 2024-06-27 NOTE — Progress Notes (Signed)
 Specialty Pharmacy Refill Coordination Note  MyChart Questionnaire Submission  Eric Underwood is a 33 y.o. male contacted today regarding refills of specialty medication(s) Biktarvy .  Doses on hand: (Patient-Rptd) 13   Patient requested: (Patient-Rptd) Pickup at [facility] at [facility] date: 07/04/24/25  Medication will be filled on 07/03/24

## 2024-07-03 ENCOUNTER — Other Ambulatory Visit: Payer: Self-pay

## 2024-07-25 ENCOUNTER — Other Ambulatory Visit (HOSPITAL_COMMUNITY): Payer: Self-pay

## 2024-07-30 ENCOUNTER — Other Ambulatory Visit (HOSPITAL_COMMUNITY): Payer: Self-pay

## 2024-07-30 ENCOUNTER — Encounter (HOSPITAL_COMMUNITY): Payer: Self-pay

## 2024-07-30 ENCOUNTER — Other Ambulatory Visit: Payer: Self-pay

## 2024-07-30 NOTE — Progress Notes (Signed)
 Specialty Pharmacy Refill Coordination Note  MyChart Questionnaire Submission  Eric Underwood is a 34 y.o. male contacted today regarding refills of specialty medication(s) Biktarvy .  Doses on hand: (Patient-Rptd) 10   Patient requested: (Patient-Rptd) Pickup at [facility] at [facility] date: 08/08/24  Medication will be filled on 08/07/24

## 2024-08-06 ENCOUNTER — Other Ambulatory Visit: Payer: Self-pay

## 2024-08-07 ENCOUNTER — Other Ambulatory Visit: Payer: Self-pay

## 2024-09-05 ENCOUNTER — Ambulatory Visit: Admitting: Infectious Diseases
